# Patient Record
Sex: Male | Born: 1950 | Race: Black or African American | Hispanic: No | Marital: Married | Smoking: Never smoker
Health system: Southern US, Community
[De-identification: ages and names within clinical notes are randomized; demographics above are authoritative.]

## PROBLEM LIST (undated history)

## (undated) DIAGNOSIS — T8859XA Other complications of anesthesia, initial encounter: Secondary | ICD-10-CM

## (undated) DIAGNOSIS — E119 Type 2 diabetes mellitus without complications: Secondary | ICD-10-CM

## (undated) DIAGNOSIS — T4145XA Adverse effect of unspecified anesthetic, initial encounter: Secondary | ICD-10-CM

## (undated) DIAGNOSIS — I10 Essential (primary) hypertension: Secondary | ICD-10-CM

## (undated) DIAGNOSIS — C801 Malignant (primary) neoplasm, unspecified: Secondary | ICD-10-CM

## (undated) DIAGNOSIS — D649 Anemia, unspecified: Secondary | ICD-10-CM

## (undated) HISTORY — PX: OTHER SURGICAL HISTORY: SHX169

## (undated) HISTORY — DX: Type 2 diabetes mellitus without complications: E11.9

## (undated) HISTORY — PX: KNEE ARTHROSCOPY: SUR90

## (undated) HISTORY — PX: SHOULDER ARTHROSCOPY W/ ROTATOR CUFF REPAIR: SHX2400

---

## 2007-07-20 ENCOUNTER — Ambulatory Visit (HOSPITAL_BASED_OUTPATIENT_CLINIC_OR_DEPARTMENT_OTHER): Admission: RE | Admit: 2007-07-20 | Discharge: 2007-07-21 | Payer: Self-pay | Admitting: Orthopedic Surgery

## 2008-12-04 ENCOUNTER — Encounter: Admission: RE | Admit: 2008-12-04 | Discharge: 2008-12-04 | Payer: Self-pay | Admitting: Internal Medicine

## 2010-08-18 NOTE — Op Note (Signed)
NAMEORVA, GWALTNEY             ACCOUNT NO.:  [id number]   MEDICAL RECORD NO.:  [id number]          PATIENT TYPE:  AMB   LOCATION:  DSC                          FACILITY:  [facility]   PHYSICIAN:  Katy Fitch. Sypher, M.D. DATE OF BIRTH:  1950-08-10   DATE OF PROCEDURE:  07/20/2007  DATE OF DISCHARGE:                               OPERATIVE REPORT   PREOPERATIVE DIAGNOSES:  1. Complex chronic rotator cuff tear, right shoulder, with type 3      acromial morphology.  2. Significant acromioclavicular degenerative arthritis.   POSTOPERATIVE DIAGNOSES:  1. Complex chronic rotator cuff tear, right shoulder, with type 3      acromial morphology.  2. Significant acromioclavicular degenerative arthritis.  3. Identification of additional predicaments of mild adhesive      capsulitis, labral degenerative changes, and minor degenerative      changes of the superior subscapularis.   OPERATION:  1. Examination of right shoulder under anesthesia demonstrating mild      adhesive capsulitis but otherwise stable shoulder.  2. Diagnostic arthroscopy, right shoulder, followed by arthroscopic      debridement of rotator cuff tear involving the superior      subscapularis, entire supraspinatus and infraspinatus, and      debridement of labral pathology.  3. Arthroscopic subacromial decompression with bursectomy,      coracoacromial ligament relaxation, and acromioplasty.  4. Arthroscopic distal clavicle resection  5. Reconstruction of rotator cuff with a hybrid arthroscopic and open      technique.   We placed 3 medial Bio-Corkscrew anchors and performed a double-row  repair anatomically reconstructing the supraspinatus, infraspinatus to a  decorticated and profile lowered greater tuberosity   OPERATIONS:  Katy Fitch. Sypher, M.D.   ASSISTANT:  Cindee Salt, M.D.   ANESTHESIA:  General endotracheal supplemented by right interscalene  block.   SUPERVISING ANESTHESIOLOGIST:  Bedelia Person, M.D.   INDICATIONS:  Tremon Sainvil is a 60 year old left-hand dominant  gentleman referred through the courtesy of Dr. Lyna Poser of [facility].   Dr. Kevan Ny has followed Mr. Winemiller for several months of right shoulder  pain unresponsive to steroid injection.   Due to Dr. Kevan Ny identifying weakness of abduction/external rotation as  well as night pain, Mr. Ashurst was referred for an upper extremity  orthopedic consult.   On clinical examination, he was noted to have signs of impingement and  weakness of abduction/external rotation suggesting rotator cuff  pathology.  Plain x-rays of his shoulder documented unfavorable anatomy  of the acromial arch with a type 3 morphology and AC arthropathy.  There  was some reactive change in greater tuberosity.   We advised Mr. Hazelrigg to obtain an MRI of the right shoulder.   An MRI was accomplished at [facility] documenting a significant  retracted double L-shaped tear of the supraspinatus, infraspinatus with  some tendinopathy of the subscapularis.  The [facility] anatomy was unfavorable.   We advised Mr. Rehm to proceed with diagnostic arthroscopy  anticipating arthroscopic debridement, decompression, distal clavicle  resection, and either open or arthroscopic repair of the rotator cuff  tear.  Preoperatively, he was advised that if technically feasible we would  attempt an arthroscopic repair; however, due to the size of his repair,  he was advised that more likely than not an incision would be required.   After informed consent, he is brought to the operating room at this  time.   PROCEDURE:  Providence Lanius was brought to the operating room and  placed in the supine position on the operating table.   Following an anesthesia consult by Dr. Gypsy Balsam, a right interscalene block  was placed in the holding area without complication.   Excellent anesthesia of the right shoulder and forequarter was obtained.   He was  brought to room #8, placed in supine position on the operating  table, and under Dr. Burnett Corrente strict supervision, general endotracheal  anesthesia was induced.  Mr. Beever was then positioned in the beach-  chair position with the aid of torso and head holder designed for  photoarthroscopy.   The entire shoulder and arm were prepped with DuraPrep and draped with  impervious arthroscopy drapes.  One gram of Ancef was administered as an  IV prophylactic antibiotic.   Procedure commenced with examination of the right shoulder under  anesthesia.  Mr. Esterline had combined elevation to 165 degrees, slight  loss of full extension due to mild adhesive capsulitis, internal  rotation of approximately 70 degrees.   We did not manipulate his joint.   We subsequently placed the arthroscope through a standard posterior  viewing portal with blunt technique after distention of the joint with  15 mL of sterile saline brought in anteriorly with a spinal needle.   Diagnostic arthroscopy confirmed mild adhesive capsulitis and  degenerative changes of the labrum from 12 o'clock superiorly to 3  o'clock anteriorly.  The posterior labrum, inferior labrum, and inferior  recess were normal.  The superior recess was normal.  The subscapularis  was invested in adhesive capsulitis granulation tissue.  This was  cleared with suction shaver brought in anteriorly revealing a minimal  tear of the superior subscapularis less than 10% of its fibers.  The  long head of the biceps was stable at its origin at the superior labrum  and normal through the rotator interval.  The entire supraspinatus,  infraspinatus was avulsed from the greater tuberosity.  There was  reactive bone formation at the greater tuberosity.   After creation of arthroscopic portals with a spinal needle, the suction  shaver was used to debride remnants of the rotator cuff from the greater  tuberosity followed by decortication of the greater  tuberosity with  suction bur while visualizing from within the joint.  After lowering the  profile of the greater tuberosity approximately 3 mm, we placed 2 medial  Bio-Corkscrew anchors anticipating repair of the supraspinatus and  infraspinatus.  We then placed the scope in the subacromial space and  performed a debridement of the coracoacromial ligament bursectomy  followed by leveling of the acromion to a type 1 morphology and  resection of the distal 15 mm of the clavicle with arthroscopic  technique.   We then undertook a two-row repair of the supraspinatus, infraspinatus  utilizing a Scorpion suture passer to place medial sutures.  After the  sutures were placed just lateral to the musculotendinous junction, we  are able to advance the tendon to an anatomic footprint.   As we began to tie knots, we identified a predicament with one limb of  our posterior suture being snagged on an anterior  suture loop.  It  appears that with the Scorpion being placed intraarticularly a loop of  suture that was not under perfect tension was caught in the scorpion  causing a snag.   Given the effect on a double-row repair that this could have, in my  opinion, it was appropriate to abort and proceed with a direct  visualization of the tuberosity so that all suture technique would be  perfect.   The arthroscopic equipment was removed, and a 4-cm incision was  fashioned at the anterior middle-third of the deltoid.  The deltoid was  split and the tuberosity visualized.   The suture was looped, and this was easily relieved with a pair of  forceps followed by completion of a 3 Bio-Corkscrew medial row repair  followed by over-the-top technique, insetting the margin of the sutures  with 2 lateral Swivel-Lock suture anchor sets.  An anatomic repair of  the supraspinatus and infraspinatus to the decorticated and re-profiled  greater tuberosity was achieved.   A hand rasp was used to feather the  lateral margin of the acromion to  prevent residual impingement followed by thorough irrigation of  subacromial space with the arthroscopic pump.  The deltoid was  reinforced at the superior apex with a mattress suture of #2 FiberWire  followed by repair of the deltoid split with simple suture of 0 Vicryl.   The skin was repaired with subdermal sutures of 0 Vicryl and 3-0 Vicryl  and intradermal 2-0 Prolene.  The portals were repaired with mattress  suture of 3-0 Prolene.   There are no apparent complications.  Mr. Resor tolerated the surgery  and anesthesia well.  He was transferred to the recovery room with  stable signs.      Katy Fitch Sypher, M.D.  Electronically Signed     RVS/MEDQ  D:  07/20/2007  T:  07/21/2007  Job:  [id number]   cc:   Candyce Churn, M.D.

## 2010-12-29 LAB — BASIC METABOLIC PANEL
BUN: 16
CO2: 28
Chloride: 98
Creatinine, Ser: 1.08
Potassium: 4.1

## 2014-07-11 ENCOUNTER — Encounter (HOSPITAL_COMMUNITY): Payer: Self-pay | Admitting: *Deleted

## 2014-07-11 ENCOUNTER — Other Ambulatory Visit: Payer: Self-pay | Admitting: Gastroenterology

## 2014-07-15 ENCOUNTER — Ambulatory Visit (HOSPITAL_COMMUNITY): Payer: 59 | Admitting: Certified Registered"

## 2014-07-15 ENCOUNTER — Ambulatory Visit (HOSPITAL_COMMUNITY)
Admission: RE | Admit: 2014-07-15 | Discharge: 2014-07-15 | Disposition: A | Discharge status: Home / Self Care | Payer: 59 | Source: Ambulatory Visit | Attending: Gastroenterology | Admitting: Gastroenterology

## 2014-07-15 ENCOUNTER — Encounter (HOSPITAL_COMMUNITY)
Admission: RE | Disposition: A | Discharge status: Home / Self Care | Payer: Self-pay | Source: Ambulatory Visit | Attending: Gastroenterology

## 2014-07-15 ENCOUNTER — Encounter (HOSPITAL_COMMUNITY): Payer: Self-pay | Admitting: Certified Registered"

## 2014-07-15 DIAGNOSIS — K921 Melena: Secondary | ICD-10-CM | POA: Insufficient documentation

## 2014-07-15 DIAGNOSIS — D122 Benign neoplasm of ascending colon: Secondary | ICD-10-CM | POA: Insufficient documentation

## 2014-07-15 DIAGNOSIS — B9681 Helicobacter pylori [H. pylori] as the cause of diseases classified elsewhere: Secondary | ICD-10-CM | POA: Insufficient documentation

## 2014-07-15 DIAGNOSIS — C184 Malignant neoplasm of transverse colon: Secondary | ICD-10-CM | POA: Insufficient documentation

## 2014-07-15 DIAGNOSIS — K2950 Unspecified chronic gastritis without bleeding: Secondary | ICD-10-CM | POA: Insufficient documentation

## 2014-07-15 DIAGNOSIS — I10 Essential (primary) hypertension: Secondary | ICD-10-CM | POA: Diagnosis not present

## 2014-07-15 DIAGNOSIS — D509 Iron deficiency anemia, unspecified: Secondary | ICD-10-CM | POA: Diagnosis present

## 2014-07-15 DIAGNOSIS — M4302 Spondylolysis, cervical region: Secondary | ICD-10-CM | POA: Insufficient documentation

## 2014-07-15 HISTORY — DX: Essential (primary) hypertension: I10

## 2014-07-15 HISTORY — PX: COLONOSCOPY WITH PROPOFOL: SHX5780

## 2014-07-15 HISTORY — PX: ESOPHAGOGASTRODUODENOSCOPY (EGD) WITH PROPOFOL: SHX5813

## 2014-07-15 SURGERY — ESOPHAGOGASTRODUODENOSCOPY (EGD) WITH PROPOFOL
Anesthesia: Monitor Anesthesia Care

## 2014-07-15 MED ORDER — LIDOCAINE HCL (PF) 2 % IJ SOLN
INTRAMUSCULAR | Status: DC | PRN
  Administered 2014-07-15: 20 mg via INTRADERMAL

## 2014-07-15 MED ORDER — LACTATED RINGERS IV SOLN: INTRAVENOUS | Status: DC

## 2014-07-15 MED ORDER — LACTATED RINGERS IV SOLN
INTRAVENOUS | Status: DC | PRN
  Administered 2014-07-15: 07:00:00 via INTRAVENOUS

## 2014-07-15 MED ORDER — SODIUM CHLORIDE 0.9 % IV SOLN: INTRAVENOUS | Status: DC

## 2014-07-15 MED ORDER — PROPOFOL 10 MG/ML IV BOLUS
INTRAVENOUS | Status: AC
  Filled 2014-07-15: qty 20

## 2014-07-15 MED ORDER — LIDOCAINE HCL (CARDIAC) 20 MG/ML IV SOLN
INTRAVENOUS | Status: AC
  Filled 2014-07-15: qty 5

## 2014-07-15 MED ORDER — PROPOFOL 10 MG/ML IV BOLUS
INTRAVENOUS | Status: DC | PRN
  Administered 2014-07-15: 50 mg via INTRAVENOUS
  Administered 2014-07-15: 150 mg via INTRAVENOUS
  Administered 2014-07-15: 50 mg via INTRAVENOUS
  Administered 2014-07-15 (×2): 10 mg via INTRAVENOUS
  Administered 2014-07-15: 50 mg via INTRAVENOUS
  Administered 2014-07-15: 100 mg via INTRAVENOUS
  Administered 2014-07-15: 50 mg via INTRAVENOUS

## 2014-07-15 MED ORDER — SPOT INK MARKER SYRINGE KIT
PACK | SUBMUCOSAL | Status: AC
  Filled 2014-07-15: qty 5

## 2014-07-15 MED ORDER — BUTAMBEN-TETRACAINE-BENZOCAINE 2-2-14 % EX AERO
INHALATION_SPRAY | CUTANEOUS | Status: DC | PRN
  Administered 2014-07-15: 1 via TOPICAL

## 2014-07-15 MED ORDER — SPOT INK MARKER SYRINGE KIT
PACK | SUBMUCOSAL | Status: DC | PRN
  Administered 2014-07-15: 3 mL via SUBMUCOSAL

## 2014-07-15 SURGICAL SUPPLY — 22 items

## 2014-07-15 NOTE — Op Note (Signed)
Problem: Iron deficiency anemia. Hemoglobin 10.3 g. Serum iron saturation 4%. Serum ferritin 4.6 ng/mL  Endoscopist: Earle Gell  Premedication: Propofol administered by anesthesia  Procedure: Diagnostic esophagogastroduodenoscopy The patient was placed in the left lateral decubitus position. The Pentax gastroscope was passed through the posterior hypopharynx into the proximal esophagus without difficulty. The hypopharynx, larynx, and vocal cords appeared normal.  Esophagoscopy: The proximal, mid, and lower segments of the esophageal mucosa appeared normal. The squamocolumnar junction was regular in appearance and noted at 40 cm from incisor teeth.  Gastroscopy: Retroflex view of the gastric cardia and fundus was normal. The gastric body appeared normal. Mucosa in the proximal gastric antrum on the greater curvature appeared cobblestone. The gastric antrum and pylorus otherwise appeared normal. Biopsies were performed from the gastric antrum, gastric body, and gastric cardia to rule out H. pylori gastritis.  Duodenoscopy: The duodenal bulb and descending duodenum appeared normal. Small bowel biopsies were performed if needed to look for celiac disease to explain iron deficiency anemia. The biopsies were discarded after finding a circumferential tumor in the proximal transverse colon consistent with an adenocarcinoma.  Assessment: Normal esophagogastroduodenoscopy with gastric biopsies pending to look for H. pylori gastritis  Procedure: Diagnostic colonoscopy Anal inspection and digital rectal exam were normal. The Pentax pediatric colonoscope was introduced into the rectum and advanced to the cecum. A normal-appearing appendiceal orifice was identified. A normal-appearing ileocecal valve was intubated and the terminal ileum inspected. Colonic preparation for the exam today was good after vigorous water lavage of the mucus in the right colon.  Rectum. Normal. Retroflexed view of the distal  rectum normal  Sigmoid colon and descending colon. Normal  Splenic flexure. Normal  Transverse colon. 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 Biopsies were performed. Spot was used to mark the proximal and distal extent of the tumor.  Hepatic flexure. Normal  Ascending colon. From the mid ascending colon, a 5 mm sessile polyp was removed with the cold snare  Cecum and ileocecal valve. Normal  Terminal ileum. Normal  Assessment  #1. 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  #2. A 5 mm sessile polyp was removed from the mid ascending colon.  Recommendation: Refer to surgery for segmental resection of the proximal transverse colon tumor. Screen for Lynch syndrome by immunohistochemical staining of the colon cancer.

## 2014-07-15 NOTE — Anesthesia Postprocedure Evaluation (Signed)
Anesthesia Post Note  Patient: Mark Owen  Procedure(s) Performed: Procedure(s) (LRB): COLONOSCOPY WITH PROPOFOL (N/A) ESOPHAGOGASTRODUODENOSCOPY (EGD) WITH PROPOFOL (N/A)  Anesthesia type: MAC  Patient location: PACU  Post pain: Pain level controlled  Post assessment: Post-op Vital signs reviewed  Last Vitals: BP 138/99 mmHg  Pulse 59  Temp(Src) 36.4 C (Oral)  Resp 15  SpO2 100%  Post vital signs: Reviewed  Level of consciousness: awake  Complications: No apparent anesthesia complications

## 2014-07-15 NOTE — Anesthesia Preprocedure Evaluation (Signed)
Anesthesia Evaluation  Patient identified by MRN, date of birth, ID band Patient awake    Reviewed: Allergy & Precautions, NPO status , Patient's Chart, lab work & pertinent test results  Airway Mallampati: II  TM Distance: >3 FB Neck ROM: Full    Dental no notable dental hx.    Pulmonary neg pulmonary ROS,  breath sounds clear to auscultation  Pulmonary exam normal       Cardiovascular hypertension, Pt. on medications Rhythm:Regular Rate:Normal     Neuro/Psych negative neurological ROS  negative psych ROS   GI/Hepatic negative GI ROS, Neg liver ROS,   Endo/Other  negative endocrine ROS  Renal/GU negative Renal ROS     Musculoskeletal negative musculoskeletal ROS (+)   Abdominal   Peds  Hematology negative hematology ROS (+)   Anesthesia Other Findings   Reproductive/Obstetrics negative OB ROS                             Anesthesia Physical Anesthesia Plan  ASA: II  Anesthesia Plan: MAC   Post-op Pain Management:    Induction: Intravenous  Airway Management Planned: Simple Face Mask, Natural Airway and Nasal Cannula  Additional Equipment: None  Intra-op Plan:   Post-operative Plan:   Informed Consent: I have reviewed the patients History and Physical, chart, labs and discussed the procedure including the risks, benefits and alternatives for the proposed anesthesia with the patient or authorized representative who has indicated his/her understanding and acceptance.   Dental advisory given  Plan Discussed with: CRNA  Anesthesia Plan Comments:         Anesthesia Quick Evaluation

## 2014-07-15 NOTE — Transfer of Care (Signed)
Immediate Anesthesia Transfer of Care Note  Patient: Mark Owen  Procedure(s) Performed: Procedure(s) (LRB): COLONOSCOPY WITH PROPOFOL (N/A) ESOPHAGOGASTRODUODENOSCOPY (EGD) WITH PROPOFOL (N/A)  Patient Location: PACU  Anesthesia Type: MAC  Level of Consciousness: sedated, patient cooperative and responds to stimulation  Airway & Oxygen Therapy: Patient Spontanous Breathing and Patient connected to face mask oxgen  Post-op Assessment: Report given to PACU RN and Post -op Vital signs reviewed and stable  Post vital signs: Reviewed and stable  Complications: No apparent anesthesia complications

## 2014-07-15 NOTE — Discharge Instructions (Signed)
Colonoscopy, Care After °Refer to this sheet in the next few weeks. These instructions provide you with information on caring for yourself after your procedure. Your health care provider may also give you more specific instructions. Your treatment has been planned according to current medical practices, but problems sometimes occur. Call your health care provider if you have any problems or questions after your procedure. °WHAT TO EXPECT AFTER THE PROCEDURE  °After your procedure, it is typical to have the following: °· A small amount of blood in your stool. °· Moderate amounts of gas and mild abdominal cramping or bloating. °HOME CARE INSTRUCTIONS °· Do not drive, operate machinery, or sign important documents for 24 hours. °· You may shower and resume your regular physical activities, but move at a slower pace for the first 24 hours. °· Take frequent rest periods for the first 24 hours. °· Walk around or put a warm pack on your abdomen to help reduce abdominal cramping and bloating. °· Drink enough fluids to keep your urine clear or pale yellow. °· You may resume your normal diet as instructed by your health care provider. Avoid heavy or fried foods that are hard to digest. °· Avoid drinking alcohol for 24 hours or as instructed by your health care provider. °· Only take over-the-counter or prescription medicines as directed by your health care provider. °· If a tissue sample (biopsy) was taken during your procedure: °· Do not take aspirin or blood thinners for 7 days, or as instructed by your health care provider. °· Do not drink alcohol for 7 days, or as instructed by your health care provider. °· Eat soft foods for the first 24 hours. °SEEK MEDICAL CARE IF: °You have persistent spotting of blood in your stool 2-3 days after the procedure. °SEEK IMMEDIATE MEDICAL CARE IF: °· You have more than a small spotting of blood in your stool. °· You pass large blood clots in your stool. °· Your abdomen is swollen  (distended). °· You have nausea or vomiting. °· You have a fever. °· You have increasing abdominal pain that is not relieved with medicine. °Document Released: 11/04/2003 Document Revised: 01/10/2013 Document Reviewed: 11/27/2012 °Exit[employer]® Patient Information ©2015 [employer]. This information is not intended to replace advice given to you by your health care provider. Make sure you discuss any questions you have with your health care provider. °Esophagogastroduodenoscopy °Care After °Refer to this sheet in the next few weeks. These instructions provide you with information on caring for yourself after your procedure. Your caregiver may also give you more specific instructions. Your treatment has been planned according to current medical practices, but problems sometimes occur. Call your caregiver if you have any problems or questions after your procedure.  °HOME CARE INSTRUCTIONS °· Do not eat or drink anything until the numbing medicine (local anesthetic) has worn off and your gag reflex has returned. You will know that the local anesthetic has worn off when you can swallow comfortably. °· Do not drive for 12 hours after the procedure or as directed by your caregiver. °· Only take medicines as directed by your caregiver. °SEEK MEDICAL CARE IF:  °· You cannot stop coughing. °· You are not urinating at all or less than usual. °SEEK IMMEDIATE MEDICAL CARE IF: °· You have difficulty swallowing. °· You cannot eat or drink. °· You have worsening throat or chest pain. °· You have dizziness, lightheadedness, or you faint. °· You have nausea or vomiting. °· You have chills. °· You have a   fever. °· You have severe abdominal pain. °· You have black, tarry, or bloody stools. °Document Released: 03/08/2012 Document Reviewed: 03/08/2012 °[employer]® Patient Information ©2015 [employer]. This information is not intended to replace advice given to you by your health care provider. Make sure you discuss any questions you have  with your health care provider. ° ° °

## 2014-07-15 NOTE — H&P (Signed)
  Problems: Iron deficiency anemia and hematochezia. 06/15/2007 normal screening colonoscopy. Hemoglobin 10.3 g. Serum ferritin 4.6 ng/mL.  History: The patient is a 64 year old male born 06-08-1950. He underwent a normal screening colonoscopy in March 2009.  The patient has been feeling well and exercises on a regular basis. He remains quite physically active and has not been experiencing fatigue or shortness of breath.  In February 2016, the patient was diagnosed with iron deficiency anemia. The stool samples he submitted for Hemoccult testing were negative.  He reports no abdominal pain or hematuria. He occasionally spots of blood on the toilet tissue following bowel movements.  The patient takes up to 4 ibuprofen tablets per week.  He is scheduled to undergo diagnostic esophagogastroduodenoscopy and colonoscopy to evaluate iron deficiency anemia and hematochezia.  Medication allergies: Penicillin. Lumbar anesthesia.  Past medical history: Hypertension. Cervical osteoarthritis. Bilateral rotator cuff surgeries. Epidural injections for lumbar degenerative disc disease.  Exam: The patient is alert and lying comfortably on the endoscopy stretcher. Abdomen is soft and nontender to palpation. Lungs are clear to auscultation. Cardiac exam reveals a regular rhythm.  Plan: Proceed with diagnostic esophagogastroduodenoscopy and colonoscopy to evaluate iron deficiency anemia and hematochezia

## 2014-07-16 ENCOUNTER — Encounter (HOSPITAL_COMMUNITY): Payer: Self-pay | Admitting: Gastroenterology

## 2014-07-17 ENCOUNTER — Other Ambulatory Visit: Payer: Self-pay | Admitting: Gastroenterology

## 2014-07-17 DIAGNOSIS — C184 Malignant neoplasm of transverse colon: Secondary | ICD-10-CM

## 2014-07-22 ENCOUNTER — Ambulatory Visit
Admission: RE | Admit: 2014-07-22 | Discharge: 2014-07-22 | Disposition: A | Discharge status: Home / Self Care | Payer: 59 | Source: Ambulatory Visit | Attending: Gastroenterology | Admitting: Gastroenterology

## 2014-07-22 DIAGNOSIS — C184 Malignant neoplasm of transverse colon: Secondary | ICD-10-CM

## 2014-07-22 MED ORDER — IOPAMIDOL (ISOVUE-300) INJECTION 61%
100.0000 mL | Freq: Once | INTRAVENOUS | Status: AC | PRN
  Administered 2014-07-22: 100 mL via INTRAVENOUS

## 2014-07-26 ENCOUNTER — Other Ambulatory Visit: Payer: Self-pay | Admitting: General Surgery

## 2014-07-26 DIAGNOSIS — C189 Malignant neoplasm of colon, unspecified: Secondary | ICD-10-CM

## 2014-07-26 NOTE — H&P (Signed)
Mark Owen 07/26/2014 2:00 PM Location: [facility] Patient #: [id number] DOB: 1950/07/03 Married / Language: English / Race: Black or African American Male History of Present Illness Mark Ruff MD; 3/81/0175 3:18 PM) Patient words: colon cancer.  The patient is a 64 year old male who presents with colorectal cancer. 64 year old male who presented with chronic anemia. He underwent a colonoscopy which showed a mass in his hepatic flexure. Biopsies confirm adenocarcinoma. CT scans of chest abdomen and pelvis showed no signs of metastatic disease. The mass is tattooed proximally and distally. It is clearly seen at the hepatic flexure on CT scan. Patient denies any changes in his bowel habits. He is currently taking iron on a twice a day basis. He denies any weight loss or abdominal pain. Other Problems Mark Owen, CMA; 07/26/2014 2:01 PM) Back Pain Colon Cancer High blood pressure  Past Surgical History Mark Owen, CMA; 07/26/2014 2:01 PM) Colon Polyp Removal - Colonoscopy Colon Polyp Removal - Open Knee Surgery Right. Oral Surgery Shoulder Surgery Bilateral.  Diagnostic Studies History Mark Owen, CMA; 07/26/2014 2:01 PM) Colonoscopy within last year  Allergies Mark Owen, [place]; 07/26/2014 2:03 PM) Penicillamine *ASSORTED CLASSES*  Medication History Mark Owen, CMA; 07/26/2014 2:05 PM) Lisinopril-Hydrochlorothiazide (10-12.5MG  Tablet, Oral) Active. Iron (65MG  Tablet, Oral) Active. Multivitamins (Oral) Active. Medications Reconciled  Social History Mark Owen, CMA; 07/26/2014 2:01 PM) Caffeine use Carbonated beverages, Coffee, Tea. No alcohol use No drug use Tobacco use Never smoker.  Family History Mark Owen, [place]; 07/26/2014 2:01 PM) Alcohol Abuse Father. Diabetes Mellitus Father. Hypertension Family Members In General, Father. Kidney Disease Father.     Review of Systems ([place]; 07/26/2014 2:01  PM) General Present- Fatigue. Not Present- Appetite Loss, Chills, Fever, Night Sweats, Weight Gain and Weight Loss. Skin Present- Dryness. Not Present- Change in Wart/Mole, Hives, Jaundice, New Lesions, Non-Healing Wounds, Rash and Ulcer. HEENT Present- Wears glasses/contact lenses. Not Present- Earache, Hearing Loss, Hoarseness, Nose Bleed, Oral Ulcers, Ringing in the Ears, Seasonal Allergies, Sinus Pain, Sore Throat, Visual Disturbances and Yellow Eyes. Respiratory Not Present- Bloody sputum, Chronic Cough, Difficulty Breathing, Snoring and Wheezing. Breast Not Present- Breast Mass, Breast Pain, Nipple Discharge and Skin Changes. Cardiovascular Present- Leg Cramps. Not Present- Chest Pain, Difficulty Breathing Lying Down, Palpitations, Rapid Heart Rate, Shortness of Breath and Swelling of Extremities. Gastrointestinal Present- Excessive gas. Not Present- Abdominal Pain, Bloating, Bloody Stool, Change in Bowel Habits, Chronic diarrhea, Constipation, Difficulty Swallowing, Gets full quickly at meals, Hemorrhoids, Indigestion, Nausea, Rectal Pain and Vomiting. Male Genitourinary Not Present- Blood in Urine, Change in Urinary Stream, Frequency, Impotence, Nocturia, Painful Urination, Urgency and Urine Leakage. Musculoskeletal Present- Back Pain. Not Present- Joint Pain, Joint Stiffness, Muscle Pain, Muscle Weakness and Swelling of Extremities. Neurological Not Present- Decreased Memory, Fainting, Headaches, Numbness, Seizures, Tingling, Tremor, Trouble walking and Weakness. Psychiatric Not Present- Anxiety, Bipolar, Change in Sleep Pattern, Depression, Fearful and Frequent crying. Endocrine Not Present- Cold Intolerance, Excessive Hunger, Hair Changes, Heat Intolerance, Hot flashes and New Diabetes. Hematology Not Present- Easy Bruising, Excessive bleeding, Gland problems, HIV and Persistent Infections.  Vitals (Mark Owen CMA; 07/26/2014 2:02 PM) 07/26/2014 2:01 PM Weight: 185.2 lb Height:  72in Body Surface Area: 2.07 m Body Mass Index: 25.12 kg/m Temp.: 97.61F(Temporal)  Pulse: 77 (Regular)  BP: 128/78 (Sitting, Left Arm, Standard)     Physical Exam Mark Ruff MD; 04/06/5850 3:19 PM)  General Mental Status-Alert. General Appearance-Consistent with stated age. Hydration-Well hydrated. Voice-Normal.  Head and Neck Head-normocephalic, atraumatic with no lesions or  palpable masses. Trachea-midline. Thyroid Gland Characteristics - normal size and consistency.  Eye Eyeball - Bilateral-Extraocular movements intact. Sclera/Conjunctiva - Bilateral-No scleral icterus.  Chest and Lung Exam Chest and lung exam reveals -quiet, even and easy respiratory effort with no use of accessory muscles and on auscultation, normal breath sounds, no adventitious sounds and normal vocal resonance. Inspection Chest Wall - Normal. Back - normal.  Cardiovascular Cardiovascular examination reveals -normal heart sounds, regular rate and rhythm with no murmurs and normal pedal pulses bilaterally.  Abdomen Inspection Inspection of the abdomen reveals - No Hernias. Palpation/Percussion Palpation and Percussion of the abdomen reveal - Soft, Non Tender, No Rebound tenderness, No Rigidity (guarding) and No hepatosplenomegaly. Auscultation Auscultation of the abdomen reveals - Bowel sounds normal.  Neurologic Neurologic evaluation reveals -alert and oriented x 3 with no impairment of recent or remote memory. Mental Status-Normal.  Musculoskeletal Global Assessment -Note:no gross deformities.  Normal Exam - Left-Upper Extremity Strength Normal and Lower Extremity Strength Normal. Normal Exam - Right-Upper Extremity Strength Normal and Lower Extremity Strength Normal.    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 Hepatic flexure cancer was noted. This was tattooed distally and proximally. CT scan of the chest and pelvis showed no signs of metastatic disease. His CEA level is pending. I have recommended that he undergo minimally invasive right hemicolectomy.  The surgery and anatomy were described to the patient as well as the risks of surgery and the possible complications. These include: Bleeding, deep abdominal infections and possible wound complications such as hernia and infection, damage to adjacent structures, leak of surgical connections, which can lead to other surgeries and possibly an ostomy, possible need for other procedures, such as abscess drains in radiology, possible prolonged hospital stay, possible diarrhea from removal of part of the colon, possible constipation from narcotics, possible bowel, bladder or sexual dysfunction if having rectal surgery, prolonged fatigue/weakness or appetite loss, possible early recurrence of of disease, possible complications of their medical problems such as heart disease or arrhythmias or lung problems, death (less than 1%). I believe the patient understands and wishes to proceed with the surgery.  Current Plans Pt Education - [facility] Colon Bowel Prep 2015 Miralax/Antibiotics Started Neomycin Sulfate 500MG , 2 (two) Tablet SEE NOTE, #6, 07/26/2014, No Refill. Local Order: TAKE TWO TABLETS AT 2 PM, 3 PM, AND 10 PM THE DAY PRIOR TO SURGERY Started Flagyl 500MG , 2 (two) Tablet SEE NOTE, #6, 07/26/2014, No Refill. Local Order: Take at 2pm, 3pm, and 10pm the day prior to your colon operation Pt Education - [facility] Colorectal Cancer (AT)

## 2014-08-05 ENCOUNTER — Encounter (HOSPITAL_COMMUNITY)
Admission: RE | Admit: 2014-08-05 | Discharge: 2014-08-05 | Disposition: A | Discharge status: Home / Self Care | Payer: 59 | Source: Ambulatory Visit | Attending: General Surgery | Admitting: General Surgery

## 2014-08-05 ENCOUNTER — Encounter (HOSPITAL_COMMUNITY): Payer: Self-pay

## 2014-08-05 DIAGNOSIS — C189 Malignant neoplasm of colon, unspecified: Secondary | ICD-10-CM | POA: Insufficient documentation

## 2014-08-05 DIAGNOSIS — Z01812 Encounter for preprocedural laboratory examination: Secondary | ICD-10-CM | POA: Insufficient documentation

## 2014-08-05 DIAGNOSIS — Z01810 Encounter for preprocedural cardiovascular examination: Secondary | ICD-10-CM

## 2014-08-05 HISTORY — DX: Malignant (primary) neoplasm, unspecified: C80.1

## 2014-08-05 HISTORY — DX: Adverse effect of unspecified anesthetic, initial encounter: T41.45XA

## 2014-08-05 HISTORY — DX: Other complications of anesthesia, initial encounter: T88.59XA

## 2014-08-05 HISTORY — DX: Anemia, unspecified: D64.9

## 2014-08-05 LAB — CBC
HEMATOCRIT: 35.6 % — AB (ref 39.0–52.0)
HEMOGLOBIN: 11 g/dL — AB (ref 13.0–17.0)
MCH: 23.5 pg — ABNORMAL LOW (ref 26.0–34.0)
MCHC: 30.9 g/dL (ref 30.0–36.0)
MCV: 76.1 fL — ABNORMAL LOW (ref 78.0–100.0)
PLATELETS: 290 10*3/uL (ref 150–400)
RBC: 4.68 MIL/uL (ref 4.22–5.81)
RDW: 19.4 % — AB (ref 11.5–15.5)
WBC: 6.6 10*3/uL (ref 4.0–10.5)

## 2014-08-05 LAB — BASIC METABOLIC PANEL
ANION GAP: 7 (ref 5–15)
BUN: 23 mg/dL — ABNORMAL HIGH (ref 6–20)
CALCIUM: 9.5 mg/dL (ref 8.9–10.3)
CHLORIDE: 104 mmol/L (ref 101–111)
CO2: 25 mmol/L (ref 22–32)
Creatinine, Ser: 1.63 mg/dL — ABNORMAL HIGH (ref 0.61–1.24)
GFR, EST AFRICAN AMERICAN: 51 mL/min — AB (ref >60)
GFR, EST NON AFRICAN AMERICAN: 44 mL/min — AB (ref >60)
GLUCOSE: 127 mg/dL — AB (ref 70–99)
POTASSIUM: 3.5 mmol/L (ref 3.5–5.1)
Sodium: 136 mmol/L (ref 135–145)

## 2014-08-05 LAB — ABO/RH: ABO/RH(D): AB POS

## 2014-08-05 NOTE — Progress Notes (Signed)
   08/05/14 1551  OBSTRUCTIVE SLEEP APNEA  Have you ever been diagnosed with sleep apnea through a sleep study? No  Do you snore loudly (loud enough to be heard through closed doors)?  0  Do you often feel tired, fatigued, or sleepy during the daytime? 0  Has anyone observed you stop breathing during your sleep? 0  Do you have, or are you being treated for high blood pressure? 1  BMI more than 35 kg/m2? 0  Age over 64 years old? 1  Neck circumference greater than 40 cm/16 inches? 1  Gender: 1

## 2014-08-05 NOTE — Patient Instructions (Signed)
ESCO JOSLYN  08/05/2014   Your procedure is scheduled on: Thursday 08/08/2014  Report to [facility] Main  Entrance and follow signs to               [place] at 1100 AM.  Call this number if you have problems the morning of surgery [phone]   Remember: [place] DR. THOMAS'S OFFICE!   Do not eat foods :After Midnight. MAY HAVE CLEAR LIQUIDS FROM MIDNIGHT UP UNTIL 0700 AM MORNING OF SURGERY THEN NOTHING UNTIL AFTER SURGERY!     Take these medicines the morning of surgery with A SIP OF WATER: NONE                               You may not have any metal on your body including hair pins and              piercings  Do not wear jewelry, make-up, lotions, powders or perfumes.             Do not wear nail polish.  Do not shave  48 hours prior to surgery.              Men may shave face and neck.   Do not bring valuables to the hospital. [place].  Contacts, dentures or bridgework may not be worn into surgery.  Leave suitcase in the car. After surgery it may be brought to your room.     Patients discharged the day of surgery will not be allowed to drive home.  Name and phone number of your driver:  Special Instructions: N/A              Please read over the following fact sheets you were given: _____________________________________________________________________             [facility] - Preparing for Surgery Before surgery, you can play an important role.  Because skin is not sterile, your skin needs to be as free of germs as possible.  You can reduce the number of germs on your skin by washing with CHG (chlorahexidine gluconate) soap before surgery.  CHG is an antiseptic cleaner which kills germs and bonds with the skin to continue killing germs even after washing. Please DO NOT use if you have an allergy to CHG or antibacterial soaps.  If your skin becomes  reddened/irritated stop using the CHG and inform your nurse when you arrive at Short Stay. Do not shave (including legs and underarms) for at least 48 hours prior to the first CHG shower.  You may shave your face/neck. Please follow these instructions carefully:  1.  Shower with CHG Soap the night before surgery and the  morning of Surgery.  2.  If you choose to wash your hair, wash your hair first as usual with your  normal  shampoo.  3.  After you shampoo, rinse your hair and body thoroughly to remove the  shampoo.                           4.  Use CHG as you would any other liquid soap.  You can apply chg directly  to the skin  and wash                       Gently with a scrungie or clean washcloth.  5.  Apply the CHG Soap to your body ONLY FROM THE NECK DOWN.   Do not use on face/ open                           Wound or open sores. Avoid contact with eyes, ears mouth and genitals (private parts).                       Wash face,  Genitals (private parts) with your normal soap.             6.  Wash thoroughly, paying special attention to the area where your surgery  will be performed.  7.  Thoroughly rinse your body with warm water from the neck down.  8.  DO NOT shower/wash with your normal soap after using and rinsing off  the CHG Soap.                9.  Pat yourself dry with a clean towel.            10.  Wear clean pajamas.            11.  Place clean sheets on your bed the night of your first shower and do not  sleep with pets. Day of Surgery : Do not apply any lotions/deodorants the morning of surgery.  Please wear clean clothes to the hospital/surgery center.  FAILURE TO FOLLOW THESE INSTRUCTIONS MAY RESULT IN THE CANCELLATION OF YOUR SURGERY PATIENT SIGNATURE_________________________________  NURSE SIGNATURE__________________________________  ________________________________________________________________________   Adam Phenix  An incentive spirometer is a tool that  can help keep your lungs clear and active. This tool measures how well you are filling your lungs with each breath. Taking long deep breaths may help reverse or decrease the chance of developing breathing (pulmonary) problems (especially infection) following:  A long period of time when you are unable to move or be active. BEFORE THE PROCEDURE   If the spirometer includes an indicator to show your best effort, your nurse or respiratory therapist will set it to a desired goal.  If possible, sit up straight or lean slightly forward. Try not to slouch.  Hold the incentive spirometer in an upright position. INSTRUCTIONS FOR USE   Sit on the edge of your bed if possible, or sit up as far as you can in bed or on a chair.  Hold the incentive spirometer in an upright position.  Breathe out normally.  Place the mouthpiece in your mouth and seal your lips tightly around it.  Breathe in slowly and as deeply as possible, raising the piston or the ball toward the top of the column.  Hold your breath for 3-5 seconds or for as long as possible. Allow the piston or ball to fall to the bottom of the column.  Remove the mouthpiece from your mouth and breathe out normally.  Rest for a few seconds and repeat Steps 1 through 7 at least 10 times every 1-2 hours when you are awake. Take your time and take a few normal breaths between deep breaths.  The spirometer may include an indicator to show your best effort. Use the indicator as a goal to work toward during each repetition.  After each set of 10  deep breaths, practice coughing to be sure your lungs are clear. If you have an incision (the cut made at the time of surgery), support your incision when coughing by placing a pillow or rolled up towels firmly against it. Once you are able to get out of bed, walk around indoors and cough well. You may stop using the incentive spirometer when instructed by your caregiver.  RISKS AND COMPLICATIONS  Take your  time so you do not get dizzy or light-headed.  If you are in pain, you may need to take or ask for pain medication before doing incentive spirometry. It is harder to take a deep breath if you are having pain. AFTER USE  Rest and breathe slowly and easily.  It can be helpful to keep track of a log of your progress. Your caregiver can provide you with a simple table to help with this. If you are using the spirometer at home, follow these instructions: [place] IF:   You are having difficultly using the spirometer.  You have trouble using the spirometer as often as instructed.  Your pain medication is not giving enough relief while using the spirometer.  You develop fever of 100.5 F (38.1 C) or higher. SEEK IMMEDIATE MEDICAL CARE IF:   You cough up bloody sputum that had not been present before.  You develop fever of 102 F (38.9 C) or greater.  You develop worsening pain at or near the incision site. MAKE SURE YOU:   Understand these instructions.  Will watch your condition.  Will get help right away if you are not doing well or get worse. Document Released: 08/02/2006 Document Revised: 06/14/2011 Document Reviewed: 10/03/2006 ExitCare Patient Information 2014 ExitCare, [place].   ________________________________________________________________________  WHAT IS A BLOOD TRANSFUSION? Blood Transfusion Information  A transfusion is the replacement of blood or some of its parts. Blood is made up of multiple cells which provide different functions.  Red blood cells carry oxygen and are used for blood loss replacement.  White blood cells fight against infection.  Platelets control bleeding.  Plasma helps clot blood.  Other blood products are available for specialized needs, such as hemophilia or other clotting disorders. BEFORE THE TRANSFUSION  Who gives blood for transfusions?   Healthy volunteers who are fully evaluated to make sure their blood is safe. This  is blood bank blood. Transfusion therapy is the safest it has ever been in the practice of medicine. Before blood is taken from a donor, a complete history is taken to make sure that person has no history of diseases nor engages in risky social behavior (examples are intravenous drug use or sexual activity with multiple partners). The donor's travel history is screened to minimize risk of transmitting infections, such as malaria. The donated blood is tested for signs of infectious diseases, such as HIV and hepatitis. The blood is then tested to be sure it is compatible with you in order to minimize the chance of a transfusion reaction. If you or a relative donates blood, this is often done in anticipation of surgery and is not appropriate for emergency situations. It takes many days to process the donated blood. RISKS AND COMPLICATIONS Although transfusion therapy is very safe and saves many lives, the main dangers of transfusion include:   Getting an infectious disease.  Developing a transfusion reaction. This is an allergic reaction to something in the blood you were given. Every precaution is taken to prevent this. The decision to have a blood transfusion  has been considered carefully by your caregiver before blood is given. Blood is not given unless the benefits outweigh the risks. AFTER THE TRANSFUSION  Right after receiving a blood transfusion, you will usually feel much better and more energetic. This is especially true if your red blood cells have gotten low (anemic). The transfusion raises the level of the red blood cells which carry oxygen, and this usually causes an energy increase.  The nurse administering the transfusion will monitor you carefully for complications. HOME CARE INSTRUCTIONS  No special instructions are needed after a transfusion. You may find your energy is better. Speak with your caregiver about any limitations on activity for underlying diseases you may have. SEEK  MEDICAL CARE IF:   Your condition is not improving after your transfusion.  You develop redness or irritation at the intravenous (IV) site. SEEK IMMEDIATE MEDICAL CARE IF:  Any of the following symptoms occur over the next 12 hours:  Shaking chills.  You have a temperature by mouth above 102 F (38.9 C), not controlled by medicine.  Chest, back, or muscle pain.  People around you feel you are not acting correctly or are confused.  Shortness of breath or difficulty breathing.  Dizziness and fainting.  You get a rash or develop hives.  You have a decrease in urine output.  Your urine turns a dark color or changes to pink, red, or brown. Any of the following symptoms occur over the next 10 days:  You have a temperature by mouth above 102 F (38.9 C), not controlled by medicine.  Shortness of breath.  Weakness after normal activity.  The white part of the eye turns yellow (jaundice).  You have a decrease in the amount of urine or are urinating less often.  Your urine turns a dark color or changes to pink, red, or brown. Document Released: 03/19/2000 Document Revised: 06/14/2011 Document Reviewed: 11/06/2007 ExitCare Patient Information 2014 ExitCare, [place].  _______________________________________________________________________   CLEAR LIQUID DIET   Foods Allowed                                                                     Foods Excluded  Coffee and tea, regular and decaf                             liquids that you cannot  Plain Jell-O in any flavor                                             see through such as: Fruit ices (not with fruit pulp)                                     milk, soups, orange juice  Iced Popsicles                                    All solid food Carbonated beverages, regular and diet  Cranberry, grape and apple juices Sports drinks like Gatorade Lightly seasoned clear broth or consume(fat  free) Sugar, honey syrup  Sample Menu Breakfast                                Lunch                                     Supper Cranberry juice                    Beef broth                            Chicken broth Jell-O                                     Grape juice                           Apple juice Coffee or tea                        Jell-O                                      Popsicle                                                Coffee or tea                        Coffee or tea  _____________________________________________________________________

## 2014-08-06 LAB — HEMOGLOBIN A1C
Hgb A1c MFr Bld: 5.9 % — ABNORMAL HIGH (ref 4.8–5.6)
Mean Plasma Glucose: 123 mg/dL

## 2014-08-07 MED ORDER — GENTAMICIN SULFATE 40 MG/ML IJ SOLN
5.0000 mg/kg | INTRAVENOUS | Status: AC
  Administered 2014-08-08: 420 mg via INTRAVENOUS
  Filled 2014-08-07: qty 10.5

## 2014-08-07 MED ORDER — CLINDAMYCIN PHOSPHATE 900 MG/50ML IV SOLN
900.0000 mg | INTRAVENOUS | Status: AC
  Administered 2014-08-08: 900 mg via INTRAVENOUS

## 2014-08-08 ENCOUNTER — Inpatient Hospital Stay (HOSPITAL_COMMUNITY): Payer: 59 | Admitting: Anesthesiology

## 2014-08-08 ENCOUNTER — Encounter (HOSPITAL_COMMUNITY)
Admission: RE | Disposition: A | Discharge status: Home / Self Care | Payer: Self-pay | Source: Ambulatory Visit | Attending: General Surgery

## 2014-08-08 ENCOUNTER — Encounter (HOSPITAL_COMMUNITY): Payer: Self-pay | Admitting: Anesthesiology

## 2014-08-08 ENCOUNTER — Inpatient Hospital Stay (HOSPITAL_COMMUNITY)
Admission: RE | Admit: 2014-08-08 | Discharge: 2014-08-20 | DRG: 354 | Disposition: A | Discharge status: Home / Self Care | Payer: 59 | Source: Ambulatory Visit | Attending: General Surgery | Admitting: General Surgery

## 2014-08-08 DIAGNOSIS — Z833 Family history of diabetes mellitus: Secondary | ICD-10-CM

## 2014-08-08 DIAGNOSIS — C182 Malignant neoplasm of ascending colon: Secondary | ICD-10-CM | POA: Diagnosis present

## 2014-08-08 DIAGNOSIS — I10 Essential (primary) hypertension: Secondary | ICD-10-CM | POA: Diagnosis present

## 2014-08-08 DIAGNOSIS — K567 Ileus, unspecified: Secondary | ICD-10-CM | POA: Diagnosis not present

## 2014-08-08 DIAGNOSIS — D649 Anemia, unspecified: Secondary | ICD-10-CM | POA: Diagnosis present

## 2014-08-08 DIAGNOSIS — C189 Malignant neoplasm of colon, unspecified: Secondary | ICD-10-CM | POA: Diagnosis present

## 2014-08-08 DIAGNOSIS — Z01812 Encounter for preprocedural laboratory examination: Secondary | ICD-10-CM | POA: Diagnosis not present

## 2014-08-08 DIAGNOSIS — K9189 Other postprocedural complications and disorders of digestive system: Secondary | ICD-10-CM

## 2014-08-08 DIAGNOSIS — K429 Umbilical hernia without obstruction or gangrene: Secondary | ICD-10-CM | POA: Diagnosis present

## 2014-08-08 HISTORY — PX: UMBILICAL HERNIA REPAIR: SHX196

## 2014-08-08 LAB — TYPE AND SCREEN
ABO/RH(D): AB POS
Antibody Screen: NEGATIVE

## 2014-08-08 SURGERY — COLECTOMY, PARTIAL, ROBOT-ASSISTED, LAPAROSCOPIC
Anesthesia: General | Site: Abdomen

## 2014-08-08 MED ORDER — SUFENTANIL CITRATE 50 MCG/ML IV SOLN
INTRAVENOUS | Status: DC | PRN
  Administered 2014-08-08: 15 ug via INTRAVENOUS
  Administered 2014-08-08: 10 ug via INTRAVENOUS
  Administered 2014-08-08: 5 ug via INTRAVENOUS
  Administered 2014-08-08: 10 ug via INTRAVENOUS

## 2014-08-08 MED ORDER — LISINOPRIL-HYDROCHLOROTHIAZIDE 10-12.5 MG PO TABS: 0.5000 | ORAL_TABLET | Freq: Every morning | ORAL | Status: DC

## 2014-08-08 MED ORDER — ALVIMOPAN 12 MG PO CAPS
12.0000 mg | ORAL_CAPSULE | Freq: Two times a day (BID) | ORAL | Status: DC
  Administered 2014-08-09: 12 mg via ORAL
  Filled 2014-08-08 (×2): qty 1

## 2014-08-08 MED ORDER — LIDOCAINE HCL (CARDIAC) 20 MG/ML IV SOLN
INTRAVENOUS | Status: DC | PRN
Start: 2014-08-08 — End: 2014-08-08
  Administered 2014-08-08: 50 mg via INTRAVENOUS

## 2014-08-08 MED ORDER — ENOXAPARIN SODIUM 40 MG/0.4ML ~~LOC~~ SOLN
40.0000 mg | SUBCUTANEOUS | Status: DC
  Administered 2014-08-09 – 2014-08-20 (×12): 40 mg via SUBCUTANEOUS
  Filled 2014-08-08 (×14): qty 0.4

## 2014-08-08 MED ORDER — GLYCOPYRROLATE 0.2 MG/ML IJ SOLN
INTRAMUSCULAR | Status: DC | PRN
  Administered 2014-08-08: .8 mg via INTRAVENOUS

## 2014-08-08 MED ORDER — ONDANSETRON HCL 4 MG PO TABS
4.0000 mg | ORAL_TABLET | Freq: Four times a day (QID) | ORAL | Status: DC | PRN
  Administered 2014-08-14: 4 mg via ORAL
  Filled 2014-08-08: qty 1

## 2014-08-08 MED ORDER — LIDOCAINE HCL (CARDIAC) 20 MG/ML IV SOLN
INTRAVENOUS | Status: AC
  Filled 2014-08-08: qty 5

## 2014-08-08 MED ORDER — ROCURONIUM BROMIDE 100 MG/10ML IV SOLN
INTRAVENOUS | Status: AC
  Filled 2014-08-08: qty 1

## 2014-08-08 MED ORDER — NEOSTIGMINE METHYLSULFATE 10 MG/10ML IV SOLN
INTRAVENOUS | Status: DC | PRN
  Administered 2014-08-08: 5 mg via INTRAVENOUS

## 2014-08-08 MED ORDER — 0.9 % SODIUM CHLORIDE (POUR BTL) OPTIME
TOPICAL | Status: DC | PRN
  Administered 2014-08-08: 2000 mL

## 2014-08-08 MED ORDER — PROPOFOL 10 MG/ML IV BOLUS
INTRAVENOUS | Status: AC
  Filled 2014-08-08: qty 20

## 2014-08-08 MED ORDER — LACTATED RINGERS IV SOLN
INTRAVENOUS | Status: DC
  Administered 2014-08-08: 1000 mL via INTRAVENOUS

## 2014-08-08 MED ORDER — ONDANSETRON HCL 4 MG/2ML IJ SOLN
INTRAMUSCULAR | Status: DC | PRN
  Administered 2014-08-08: 4 mg via INTRAVENOUS

## 2014-08-08 MED ORDER — METOCLOPRAMIDE HCL 5 MG/ML IJ SOLN
INTRAMUSCULAR | Status: DC | PRN
  Administered 2014-08-08: 10 mg via INTRAVENOUS

## 2014-08-08 MED ORDER — OXYCODONE-ACETAMINOPHEN 5-325 MG PO TABS
1.0000 | ORAL_TABLET | ORAL | Status: DC | PRN
  Administered 2014-08-09 – 2014-08-13 (×10): 2 via ORAL
  Filled 2014-08-08 (×12): qty 2

## 2014-08-08 MED ORDER — ROCURONIUM BROMIDE 100 MG/10ML IV SOLN
INTRAVENOUS | Status: DC | PRN
  Administered 2014-08-08: 15 mg via INTRAVENOUS
  Administered 2014-08-08 (×2): 10 mg via INTRAVENOUS
  Administered 2014-08-08: 15 mg via INTRAVENOUS
  Administered 2014-08-08: 50 mg via INTRAVENOUS

## 2014-08-08 MED ORDER — LACTATED RINGERS IV SOLN
INTRAVENOUS | Status: DC | PRN
  Administered 2014-08-08 (×2): via INTRAVENOUS

## 2014-08-08 MED ORDER — BUPIVACAINE-EPINEPHRINE 0.25% -1:200000 IJ SOLN
INTRAMUSCULAR | Status: DC | PRN
  Administered 2014-08-08: 15 mL

## 2014-08-08 MED ORDER — DIPHENHYDRAMINE HCL 50 MG/ML IJ SOLN: 12.5000 mg | Freq: Four times a day (QID) | INTRAMUSCULAR | Status: DC | PRN

## 2014-08-08 MED ORDER — MIDAZOLAM HCL 2 MG/2ML IJ SOLN
INTRAMUSCULAR | Status: AC
  Filled 2014-08-08: qty 2

## 2014-08-08 MED ORDER — ONDANSETRON HCL 4 MG/2ML IJ SOLN
INTRAMUSCULAR | Status: AC
  Filled 2014-08-08: qty 2

## 2014-08-08 MED ORDER — BUPIVACAINE-EPINEPHRINE (PF) 0.25% -1:200000 IJ SOLN
INTRAMUSCULAR | Status: AC
  Filled 2014-08-08: qty 30

## 2014-08-08 MED ORDER — SUFENTANIL CITRATE 50 MCG/ML IV SOLN
INTRAVENOUS | Status: AC
  Filled 2014-08-08: qty 1

## 2014-08-08 MED ORDER — LACTATED RINGERS IV SOLN
INTRAVENOUS | Status: DC
  Administered 2014-08-08 – 2014-08-09 (×2): via INTRAVENOUS

## 2014-08-08 MED ORDER — HYDROMORPHONE HCL 1 MG/ML IJ SOLN
INTRAMUSCULAR | Status: AC
  Filled 2014-08-08: qty 1

## 2014-08-08 MED ORDER — LISINOPRIL 5 MG PO TABS
5.0000 mg | ORAL_TABLET | Freq: Every day | ORAL | Status: DC
  Administered 2014-08-09 – 2014-08-20 (×12): 5 mg via ORAL
  Filled 2014-08-08 (×12): qty 1

## 2014-08-08 MED ORDER — HYDROMORPHONE HCL 1 MG/ML IJ SOLN
0.2500 mg | INTRAMUSCULAR | Status: DC | PRN
  Administered 2014-08-08: 0.25 mg via INTRAVENOUS
  Administered 2014-08-08: 0.5 mg via INTRAVENOUS
  Administered 2014-08-08 (×2): 0.25 mg via INTRAVENOUS
  Administered 2014-08-08: 0.5 mg via INTRAVENOUS
  Administered 2014-08-08: 0.25 mg via INTRAVENOUS

## 2014-08-08 MED ORDER — MORPHINE SULFATE 2 MG/ML IJ SOLN
2.0000 mg | INTRAMUSCULAR | Status: DC | PRN
  Administered 2014-08-08: 2 mg via INTRAVENOUS
  Administered 2014-08-08: 4 mg via INTRAVENOUS
  Administered 2014-08-09 (×2): 2 mg via INTRAVENOUS
  Administered 2014-08-09: 4 mg via INTRAVENOUS
  Administered 2014-08-10: 2 mg via INTRAVENOUS
  Filled 2014-08-08: qty 2
  Filled 2014-08-08: qty 1
  Filled 2014-08-08: qty 2
  Filled 2014-08-08 (×3): qty 1

## 2014-08-08 MED ORDER — ONDANSETRON HCL 4 MG/2ML IJ SOLN
4.0000 mg | Freq: Four times a day (QID) | INTRAMUSCULAR | Status: DC | PRN
  Administered 2014-08-10 – 2014-08-11 (×2): 4 mg via INTRAVENOUS
  Filled 2014-08-08 (×2): qty 2

## 2014-08-08 MED ORDER — PROPOFOL 10 MG/ML IV BOLUS
INTRAVENOUS | Status: DC | PRN
  Administered 2014-08-08: 170 mg via INTRAVENOUS

## 2014-08-08 MED ORDER — ACETAMINOPHEN 500 MG PO TABS
1000.0000 mg | ORAL_TABLET | Freq: Four times a day (QID) | ORAL | Status: AC
  Filled 2014-08-08: qty 2

## 2014-08-08 MED ORDER — LACTATED RINGERS IR SOLN
Status: DC | PRN
  Administered 2014-08-08: 1000 mL

## 2014-08-08 MED ORDER — HYDROCHLOROTHIAZIDE 10 MG/ML ORAL SUSPENSION
6.2500 mg | Freq: Every day | ORAL | Status: DC
  Administered 2014-08-09 – 2014-08-20 (×12): 6.25 mg via ORAL
  Filled 2014-08-08 (×13): qty 1.25

## 2014-08-08 MED ORDER — DIPHENHYDRAMINE HCL 12.5 MG/5ML PO ELIX: 12.5000 mg | ORAL_SOLUTION | Freq: Four times a day (QID) | ORAL | Status: DC | PRN

## 2014-08-08 MED ORDER — CLINDAMYCIN PHOSPHATE 900 MG/50ML IV SOLN
900.0000 mg | Freq: Three times a day (TID) | INTRAVENOUS | Status: AC
  Administered 2014-08-08: 900 mg via INTRAVENOUS
  Filled 2014-08-08: qty 50

## 2014-08-08 MED ORDER — DEXAMETHASONE SODIUM PHOSPHATE 10 MG/ML IJ SOLN
INTRAMUSCULAR | Status: DC | PRN
  Administered 2014-08-08: 10 mg via INTRAVENOUS

## 2014-08-08 MED ORDER — CLINDAMYCIN PHOSPHATE 900 MG/50ML IV SOLN
INTRAVENOUS | Status: AC
  Filled 2014-08-08: qty 50

## 2014-08-08 MED ORDER — MIDAZOLAM HCL 5 MG/5ML IJ SOLN
INTRAMUSCULAR | Status: DC | PRN
  Administered 2014-08-08: 2 mg via INTRAVENOUS

## 2014-08-08 MED ORDER — ALVIMOPAN 12 MG PO CAPS
12.0000 mg | ORAL_CAPSULE | Freq: Once | ORAL | Status: AC
Start: 2014-08-08 — End: 2014-08-08
  Administered 2014-08-08: 12 mg via ORAL
  Filled 2014-08-08: qty 1

## 2014-08-08 SURGICAL SUPPLY — 89 items
BLADE EXTENDED COATED 6.5IN (ELECTRODE) ×2 IMPLANT
BLADE SURG SZ11 CARB STEEL (BLADE) ×4 IMPLANT
CANNULA REDUC XI 12-8 STAPL (CANNULA) ×1
CANNULA REDUC XI 12-8MM STAPL (CANNULA) ×1
CANNULA REDUCER 12-8 DVNC XI (CANNULA) ×2 IMPLANT
CELLS DAT CNTRL 66122 CELL SVR (MISCELLANEOUS) IMPLANT
CLIP LIGATING HEM O LOK PURPLE (MISCELLANEOUS) IMPLANT
CLIP LIGATING HEMOLOK MED (MISCELLANEOUS) IMPLANT
COUNTER NEEDLE 20 DBL MAG RED (NEEDLE) ×2 IMPLANT
COVER TIP SHEARS 8 DVNC (MISCELLANEOUS) ×2 IMPLANT
COVER TIP SHEARS 8MM DA VINCI (MISCELLANEOUS) ×2
DECANTER SPIKE VIAL GLASS SM (MISCELLANEOUS) ×4 IMPLANT
DEVICE TROCAR PUNCTURE CLOSURE (ENDOMECHANICALS) ×4 IMPLANT
DRAPE ARM DVNC X/XI (DISPOSABLE) ×8 IMPLANT
DRAPE COLUMN DVNC XI (DISPOSABLE) ×2 IMPLANT
DRAPE DA VINCI XI ARM (DISPOSABLE) ×10
DRAPE DA VINCI XI COLUMN (DISPOSABLE) ×2
DRAPE SURG IRRIG POUCH 19X23 (DRAPES) ×4 IMPLANT
DRSG OPSITE POSTOP 4X10 (GAUZE/BANDAGES/DRESSINGS) IMPLANT
DRSG OPSITE POSTOP 4X6 (GAUZE/BANDAGES/DRESSINGS) ×2 IMPLANT
DRSG OPSITE POSTOP 4X8 (GAUZE/BANDAGES/DRESSINGS) IMPLANT
ELECT PENCIL ROCKER SW 15FT (MISCELLANEOUS) ×4 IMPLANT
ELECT REM PT RETURN 15FT ADLT (MISCELLANEOUS) ×2 IMPLANT
ENDOLOOP SUT PDS II  0 18 (SUTURE)
ENDOLOOP SUT PDS II 0 18 (SUTURE) IMPLANT
EVACUATOR SILICONE 100CC (DRAIN) IMPLANT
GAUZE SPONGE 4X4 12PLY STRL (GAUZE/BANDAGES/DRESSINGS) IMPLANT
GLOVE BIO SURGEON STRL SZ 6.5 (GLOVE) ×9 IMPLANT
GLOVE BIO SURGEONS STRL SZ 6.5 (GLOVE) ×3
GLOVE BIOGEL PI IND STRL 7.0 (GLOVE) ×6 IMPLANT
GLOVE BIOGEL PI INDICATOR 7.0 (GLOVE) ×6
GOWN STRL REUS W/TWL 2XL LVL3 (GOWN DISPOSABLE) ×10 IMPLANT
GOWN STRL REUS W/TWL XL LVL3 (GOWN DISPOSABLE) ×16 IMPLANT
HOLDER FOLEY CATH W/STRAP (MISCELLANEOUS) ×4 IMPLANT
LEGGING LITHOTOMY PAIR STRL (DRAPES) ×2 IMPLANT
LIQUID BAND (GAUZE/BANDAGES/DRESSINGS) ×2 IMPLANT
NDL INSUFFLATION 14GA 120MM (NEEDLE) ×2 IMPLANT
NEEDLE INSUFFLATION 14GA 120MM (NEEDLE) ×4 IMPLANT
PACK CARDIOVASCULAR III (CUSTOM PROCEDURE TRAY) ×4 IMPLANT
PACK COLON (CUSTOM PROCEDURE TRAY) ×4 IMPLANT
PACK GENERAL/GYN (CUSTOM PROCEDURE TRAY) ×4 IMPLANT
PORT LAP GEL ALEXIS MED 5-9CM (MISCELLANEOUS) ×2 IMPLANT
RELOAD STAPLE 45 BLU REG DVNC (STAPLE) IMPLANT
RELOAD STAPLE 45 GRN THCK DVNC (STAPLE) IMPLANT
RETRACTOR WND ALEXIS 18 MED (MISCELLANEOUS) IMPLANT
RTRCTR WOUND ALEXIS 18CM MED (MISCELLANEOUS)
SCISSORS LAP 5X45 EPIX DISP (ENDOMECHANICALS) ×4 IMPLANT
SEAL CANN UNIV 5-8 DVNC XI (MISCELLANEOUS) ×6 IMPLANT
SEAL XI 5MM-8MM UNIVERSAL (MISCELLANEOUS) ×6
SEALER VESSEL DA VINCI XI (MISCELLANEOUS) ×2
SEALER VESSEL EXT DVNC XI (MISCELLANEOUS) ×2 IMPLANT
SET IRRIG TUBING LAPAROSCOPIC (IRRIGATION / IRRIGATOR) ×4 IMPLANT
SLEEVE XCEL OPT CAN 5 100 (ENDOMECHANICALS) IMPLANT
SOLUTION ELECTROLUBE (MISCELLANEOUS) ×2 IMPLANT
STAPLER 45 BLU RELOAD XI (STAPLE) ×8 IMPLANT
STAPLER 45 BLUE RELOAD XI (STAPLE) ×8
STAPLER 45 GREEN RELOAD XI (STAPLE)
STAPLER 45 GRN RELOAD XI (STAPLE) IMPLANT
STAPLER CANNULA SEAL DVNC XI (STAPLE) IMPLANT
STAPLER CANNULA SEAL XI (STAPLE)
STAPLER SHEATH (SHEATH)
STAPLER SHEATH ENDOWRIST DVNC (SHEATH) IMPLANT
STAPLER VISISTAT 35W (STAPLE) ×2 IMPLANT
SUT NOVA NAB DX-16 0-1 5-0 T12 (SUTURE) ×4 IMPLANT
SUT PDS AB 1 CTX 36 (SUTURE) ×2 IMPLANT
SUT PDS AB 1 TP1 96 (SUTURE) IMPLANT
SUT PROLENE 2 0 KS (SUTURE) ×4 IMPLANT
SUT SILK 2 0 (SUTURE) ×4
SUT SILK 2 0 SH CR/8 (SUTURE) ×4 IMPLANT
SUT SILK 2-0 18XBRD TIE 12 (SUTURE) ×2 IMPLANT
SUT SILK 3 0 (SUTURE) ×4
SUT SILK 3 0 SH CR/8 (SUTURE) ×4 IMPLANT
SUT SILK 3-0 18XBRD TIE 12 (SUTURE) ×2 IMPLANT
SUT V-LOC BARB 180 2/0GR6 GS22 (SUTURE) ×8
SUT VIC AB 2-0 SH 18 (SUTURE) IMPLANT
SUT VIC AB 2-0 SH 27 (SUTURE)
SUT VIC AB 2-0 SH 27X BRD (SUTURE) IMPLANT
SUT VIC AB 4-0 PS2 27 (SUTURE) ×12 IMPLANT
SUT VICRYL 0 UR6 27IN ABS (SUTURE) ×2 IMPLANT
SUTURE V-LC BRB 180 2/0GR6GS22 (SUTURE) IMPLANT
SYRINGE 10CC LL (SYRINGE) ×4 IMPLANT
SYS LAPSCP GELPORT 120MM (MISCELLANEOUS)
SYSTEM LAPSCP GELPORT 120MM (MISCELLANEOUS) IMPLANT
TOWEL OR NON WOVEN STRL DISP B (DISPOSABLE) ×4 IMPLANT
TRAY FOLEY W/METER SILVER 14FR (SET/KITS/TRAYS/PACK) ×4 IMPLANT
TROCAR BLADELESS OPT 5 100 (ENDOMECHANICALS) ×4 IMPLANT
TUBING CONNECTING 10 (TUBING) IMPLANT
TUBING CONNECTING 10' (TUBING)
TUBING FILTER THERMOFLATOR (ELECTROSURGICAL) ×4 IMPLANT

## 2014-08-08 NOTE — Anesthesia Preprocedure Evaluation (Addendum)
Anesthesia Evaluation  Patient identified by MRN, date of birth, ID band Patient awake    Reviewed: Allergy & Precautions, NPO status , Patient's Chart, lab work & pertinent test results  History of Anesthesia Complications (+) history of anesthetic complications  Airway Mallampati: II  TM Distance: >3 FB Neck ROM: Full    Dental no notable dental hx.    Pulmonary neg pulmonary ROS,  breath sounds clear to auscultation  Pulmonary exam normal       Cardiovascular Exercise Tolerance: Good hypertension, Pt. on medications Normal cardiovascular examRhythm:Regular Rate:Normal     Neuro/Psych negative neurological ROS  negative psych ROS   GI/Hepatic negative GI ROS, Neg liver ROS,   Endo/Other  negative endocrine ROS  Renal/GU negative Renal ROS  negative genitourinary   Musculoskeletal negative musculoskeletal ROS (+)   Abdominal   Peds negative pediatric ROS (+)  Hematology negative hematology ROS (+) anemia ,   Anesthesia Other Findings   Reproductive/Obstetrics negative OB ROS                            Anesthesia Physical Anesthesia Plan  ASA: II  Anesthesia Plan: General   Post-op Pain Management:    Induction: Intravenous  Airway Management Planned: Oral ETT  Additional Equipment:   Intra-op Plan:   Post-operative Plan: Extubation in OR  Informed Consent: I have reviewed the patients History and Physical, chart, labs and discussed the procedure including the risks, benefits and alternatives for the proposed anesthesia with the patient or authorized representative who has indicated his/her understanding and acceptance.   Dental advisory given  Plan Discussed with: CRNA  Anesthesia Plan Comments:         Anesthesia Quick Evaluation

## 2014-08-08 NOTE — H&P (View-Only) (Signed)
Mark Owen 07/26/2014 2:00 PM Location: [facility] Patient #: [id number] DOB: May 26, 1950 Married / Language: English / Race: Black or African American Male History of Present Illness Mark Ruff MD; 2/42/3536 3:18 PM) Patient words: colon cancer.  The patient is a 64 year old male who presents with colorectal cancer. 64 year old male who presented with chronic anemia. He underwent a colonoscopy which showed a mass in his hepatic flexure. Biopsies confirm adenocarcinoma. CT scans of chest abdomen and pelvis showed no signs of metastatic disease. The mass is tattooed proximally and distally. It is clearly seen at the hepatic flexure on CT scan. Patient denies any changes in his bowel habits. He is currently taking iron on a twice a day basis. He denies any weight loss or abdominal pain. Other Problems Mark Owen, CMA; 07/26/2014 2:01 PM) Back Pain Colon Cancer High blood pressure  Past Surgical History Mark Owen, CMA; 07/26/2014 2:01 PM) Colon Polyp Removal - Colonoscopy Colon Polyp Removal - Open Knee Surgery Right. Oral Surgery Shoulder Surgery Bilateral.  Diagnostic Studies History Mark Owen, CMA; 07/26/2014 2:01 PM) Colonoscopy within last year  Allergies Mark Owen, Ducor; 07/26/2014 2:03 PM) Penicillamine *ASSORTED CLASSES*  Medication History Mark Owen, CMA; 07/26/2014 2:05 PM) Lisinopril-Hydrochlorothiazide (10-12.5MG  Tablet, Oral) Active. Iron (65MG  Tablet, Oral) Active. Multivitamins (Oral) Active. Medications Reconciled  Social History Mark Owen, CMA; 07/26/2014 2:01 PM) Caffeine use Carbonated beverages, Coffee, Tea. No alcohol use No drug use Tobacco use Never smoker.  Family History Mark Owen, Mark Owen; 07/26/2014 2:01 PM) Alcohol Abuse Father. Diabetes Mellitus Father. Hypertension Family Members In General, Father. Kidney Disease Father.     Review of Systems ([place]; 07/26/2014 2:01  PM) General Present- Fatigue. Not Present- Appetite Loss, Chills, Fever, Night Sweats, Weight Gain and Weight Loss. Skin Present- Dryness. Not Present- Change in Wart/Mole, Hives, Jaundice, New Lesions, Non-Healing Wounds, Rash and Ulcer. HEENT Present- Wears glasses/contact lenses. Not Present- Earache, Hearing Loss, Hoarseness, Nose Bleed, Oral Ulcers, Ringing in the Ears, Seasonal Allergies, Sinus Pain, Sore Throat, Visual Disturbances and Yellow Eyes. Respiratory Not Present- Bloody sputum, Chronic Cough, Difficulty Breathing, Snoring and Wheezing. Breast Not Present- Breast Mass, Breast Pain, Nipple Discharge and Skin Changes. Cardiovascular Present- Leg Cramps. Not Present- Chest Pain, Difficulty Breathing Lying Down, Palpitations, Rapid Heart Rate, Shortness of Breath and Swelling of Extremities. Gastrointestinal Present- Excessive gas. Not Present- Abdominal Pain, Bloating, Bloody Stool, Change in Bowel Habits, Chronic diarrhea, Constipation, Difficulty Swallowing, Gets full quickly at meals, Hemorrhoids, Indigestion, Nausea, Rectal Pain and Vomiting. Male Genitourinary Not Present- Blood in Urine, Change in Urinary Stream, Frequency, Impotence, Nocturia, Painful Urination, Urgency and Urine Leakage. Musculoskeletal Present- Back Pain. Not Present- Joint Pain, Joint Stiffness, Muscle Pain, Muscle Weakness and Swelling of Extremities. Neurological Not Present- Decreased Memory, Fainting, Headaches, Numbness, Seizures, Tingling, Tremor, Trouble walking and Weakness. Psychiatric Not Present- Anxiety, Bipolar, Change in Sleep Pattern, Depression, Fearful and Frequent crying. Endocrine Not Present- Cold Intolerance, Excessive Hunger, Hair Changes, Heat Intolerance, Hot flashes and New Diabetes. Hematology Not Present- Easy Bruising, Excessive bleeding, Gland problems, HIV and Persistent Infections.  Vitals (Sonya Owen CMA; 07/26/2014 2:02 PM) 07/26/2014 2:01 PM Weight: 185.2 lb Height:  72in Body Surface Area: 2.07 m Body Mass Index: 25.12 kg/m Temp.: 97.9F(Temporal)  Pulse: 77 (Regular)  BP: 128/78 (Sitting, Left Arm, Standard)     Physical Exam Mark Ruff MD; 1/44/3154 3:19 PM)  General Mental Status-Alert. General Appearance-Consistent with stated age. Hydration-Well hydrated. Voice-Normal.  Head and Neck Head-normocephalic, atraumatic with no lesions or  palpable masses. Trachea-midline. Thyroid Gland Characteristics - normal size and consistency.  Eye Eyeball - Bilateral-Extraocular movements intact. Sclera/Conjunctiva - Bilateral-No scleral icterus.  Chest and Lung Exam Chest and lung exam reveals -quiet, even and easy respiratory effort with no use of accessory muscles and on auscultation, normal breath sounds, no adventitious sounds and normal vocal resonance. Inspection Chest Wall - Normal. Back - normal.  Cardiovascular Cardiovascular examination reveals -normal heart sounds, regular rate and rhythm with no murmurs and normal pedal pulses bilaterally.  Abdomen Inspection Inspection of the abdomen reveals - No Hernias. Palpation/Percussion Palpation and Percussion of the abdomen reveal - Soft, Non Tender, No Rebound tenderness, No Rigidity (guarding) and No hepatosplenomegaly. Auscultation Auscultation of the abdomen reveals - Bowel sounds normal.  Neurologic Neurologic evaluation reveals -alert and oriented x 3 with no impairment of recent or remote memory. Mental Status-Normal.  Musculoskeletal Global Assessment -Note:no gross deformities.  Normal Exam - Left-Upper Extremity Strength Normal and Lower Extremity Strength Normal. Normal Exam - Right-Upper Extremity Strength Normal and Lower Extremity Strength Normal.    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 Hepatic flexure cancer was noted. This was tattooed distally and proximally. CT scan of the chest and pelvis showed no signs of metastatic disease. His CEA level is pending. I have recommended that he undergo minimally invasive right hemicolectomy.  The surgery and anatomy were described to the patient as well as the risks of surgery and the possible complications. These include: Bleeding, deep abdominal infections and possible wound complications such as hernia and infection, damage to adjacent structures, leak of surgical connections, which can lead to other surgeries and possibly an ostomy, possible need for other procedures, such as abscess drains in radiology, possible prolonged hospital stay, possible diarrhea from removal of part of the colon, possible constipation from narcotics, possible bowel, bladder or sexual dysfunction if having rectal surgery, prolonged fatigue/weakness or appetite loss, possible early recurrence of of disease, possible complications of their medical problems such as heart disease or arrhythmias or lung problems, death (less than 1%). I believe the patient understands and wishes to proceed with the surgery.  Current Plans Pt Education - [facility] Colon Bowel Prep 2015 Miralax/Antibiotics Started Neomycin Sulfate 500MG , 2 (two) Tablet SEE NOTE, #6, 07/26/2014, No Refill. Local Order: TAKE TWO TABLETS AT 2 PM, 3 PM, AND 10 PM THE DAY PRIOR TO SURGERY Started Flagyl 500MG , 2 (two) Tablet SEE NOTE, #6, 07/26/2014, No Refill. Local Order: Take at 2pm, 3pm, and 10pm the day prior to your colon operation Pt Education - [facility] Colorectal Cancer (AT)

## 2014-08-08 NOTE — Transfer of Care (Signed)
Immediate Anesthesia Transfer of Care Note  Patient: Mark Owen  Procedure(s) Performed: Procedure(s): XI ROBOT ASSISTED LAPAROSCOPIC RIGHT COLECTOMY (N/A) HERNIA REPAIR UMBILICAL ADULT  Patient Location: PACU  Anesthesia Type:General  Level of Consciousness: awake, alert  and oriented  Airway & Oxygen Therapy: Patient Spontanous Breathing and Patient connected to face mask oxygen  Post-op Assessment: Report given to RN and Post -op Vital signs reviewed and stable  Post vital signs: Reviewed and stable  Last Vitals:  Filed Vitals:   08/08/14 1115  BP: 142/79  Pulse: 67  Temp: 36.4 C  Resp: 16    Complications: No apparent anesthesia complications

## 2014-08-08 NOTE — Op Note (Signed)
08/08/2014  4:41 PM  PATIENT:  Mark Owen  64 y.o. male  Patient Care Team: Josetta Huddle, MD as PCP - General (Internal Medicine)  PRE-OPERATIVE DIAGNOSIS:  colon cancer  POST-OPERATIVE DIAGNOSIS:  colon cancer  PROCEDURE:  XI ROBOT ASSISTED RIGHT COLECTOMY UMBILICAL HERNIA REPAIR ADULT   Surgeon(s): [name], MD  ASSISTANT: Emilee Hero PA ([employer])   ANESTHESIA:   general  EBL:  64ml Total I/O In: 1000 [I.V.:1000] Out: 125 [Urine:125]  SPECIMEN:  Source of Specimen:  R colon  DISPOSITION OF SPECIMEN:  PATHOLOGY  COUNTS:  YES  PLAN OF CARE: Admit to inpatient   PATIENT DISPOSITION:  PACU - hemodynamically stable.   INDICATIONS: 64 y.o. M with hepatic flexure mass seen on colonoscopy.  Biopsy shows adenocarcinoma.  CEA was 2.4.  CT scans show no evidence of metastatic disease. The risk and benefits and alternative treatments were explained to the patient prior to the OR and the patient has elected to proceed with a robotic assisted R colectomy.  Consent was signed and placed on chart prior to the OR.   OR FINDINGS:  R colon mass in area of tattoo at the hepatic flexure.  No signs of metastatic disease.  Normal appearing gallbladder  DESCRIPTION:  The patient was identified & brought into the operating room. The patient was positioned supine with both arms tucked. SCDs were active during the entire case. The patient underwent general anesthesia without any difficulty. A foley catheter was inserted under sterile conditions. The abdomen was prepped and draped in a sterile fashion. A Surgical Timeout confirmed our plan.  I placed the patient in reverse trendelenburg with left side up.  I placed a Varies needle in the LUQ without difficulty. We induced carbon dioxide insufflation. An 77mm robotic port was placed at the umbilicus.  Camera inspection revealed no injury at either site. I placed additional ports under direct laparoscopic visualization. I placed a 88mm  port in the LUQ and a 16mm assist port in the left lateral abdomen.  I evaluated the entire abdomen laparoscopically.  The liver appeared normal, the large and small bowel were normal as well.  There were no signs of metastatic disease.  The robot was docked to the patient's right side and instruments were placed under direct visualization.    I began by mobilizing the adhesions in the RUQ to free the cecum.  I then identified the ileocolic artery and vein within the mesentery. Dissection was bluntly carried around these structures. The duodenum was identified and free from the structures. I then separated the structures bluntly and used the robotic vessel sealer device to transect these separately.  I developed the retroperitoneal plane bluntly.  I then freed the appendix off its attachments to the pelvic wall. I mobilized the terminal ileum.  I took care to avoid injuring any retroperitoneal structures.  After this I began to mobilize laterally down the white line of Toldt and then took down the hepatic flexure using the Enseal device. I mobilized the omentum off of the right transverse colon. The entire colon was then flipped medially and mobilized off of the retroperitoneal structures until I could visualize the lateral edge of the duodenum underneath.  I gently freed the duodenal attachments.  I continued dissection of the mesentery to ~5cm distal to the distal tattoo.  I divided the right branch of the middle colic low with a the vessel sealer and then came up the mesentery to the transverse colon.  I divided the  transverse colon with 2 fires of a blue load robotic stapler.  I then divided the rest of the ileocolonic mesentery with the vessel sealer.  I divided the terminal ileum with a blue load stapler.   The colon was free at that time.  I brought the end of the terminal ileum and overlapped this with the remaining transverse colon.  I intentionally placed a small enterotomy in both the end of the terminal  ileum and the transverse colon ~5cm distal to the staple line.  A blue load 60mm stapler was used to create the anastomosis.  The common channel was closed with 2 2-0 V lock sutures.  The abdomen was inspected.  Hemostasis was good.  At that point, I made an enlargement of my umbilical incision using a scalpel. The fascia was then incised using electrocautery. An alexis wound protector was placed. The terminal ileum and right colon were then removed from the wound. The abdomen was then irrigated with normal saline. The omentum was then brought down over the anastomosis. The 12 mm port site was closed with a 0 Vicryl suture and an endoscopic suture passer.  The Alexis wound protector was then removed, and we switched to clean gloves.    The fascia was then closed using #0 Novafil interrupted sutures, closing his umbilical hernia.  Ports were removed. The skin was then closed using 4-0 Vicryl sutures. Dermabond was placed on the port sites and a sterile dressing was placed over the abdominal incision.  All counts were correct per operating room staff. The patient was then awakened from anesthesia and sent to the post anesthesia care unit in stable condition.

## 2014-08-08 NOTE — Interval H&P Note (Signed)
History and Physical Interval Note:  08/08/2014 12:50 PM  Mark Owen  has presented today for surgery, with the diagnosis of colon cancer  The various methods of treatment have been discussed with the patient and family. After consideration of risks, benefits and other options for treatment, the patient has consented to  Procedure(s): XI ROBOT ASSISTED LAPAROSCOPIC PARTIAL COLECTOMY (N/A) as a surgical intervention .  The patient's history has been reviewed, patient examined, no change in status, stable for surgery.  I have reviewed the patient's chart and labs.  Questions were answered to the patient's satisfaction.     Pt has a small umbilical hernia.  We will repair this as well during surgery.    Rosario Adie, MD  [facility]

## 2014-08-08 NOTE — Anesthesia Postprocedure Evaluation (Signed)
  Anesthesia Post-op Note  Patient: [name]  Procedure(s) Performed: Procedure(s) (LRB): XI ROBOT ASSISTED LAPAROSCOPIC RIGHT COLECTOMY (N/A) HERNIA REPAIR UMBILICAL ADULT  Patient Location: PACU  Anesthesia Type: General  Level of Consciousness: awake and alert   Airway and Oxygen Therapy: Patient Spontanous Breathing  Post-op Pain: mild  Post-op Assessment: Post-op Vital signs reviewed, Patient's Cardiovascular Status Stable, Respiratory Function Stable, Patent Airway and No signs of Nausea or vomiting  Last Vitals:  Filed Vitals:   08/08/14 1930  BP: 122/64  Pulse: 71  Temp: 36.5 C  Resp: 15    Post-op Vital Signs: stable   Complications: No apparent anesthesia complications

## 2014-08-08 NOTE — Anesthesia Procedure Notes (Signed)
Procedure Name: Intubation Date/Time: 08/08/2014 1:42 PM Performed by: Sherian Maroon A Pre-anesthesia Checklist: Patient identified, Emergency Drugs available, Suction available, Patient being monitored and Timeout performed Patient Re-evaluated:Patient Re-evaluated prior to inductionOxygen Delivery Method: Circle system utilized Preoxygenation: Pre-oxygenation with 100% oxygen Intubation Type: IV induction Ventilation: Mask ventilation without difficulty Grade View: Grade I Tube type: Oral Tube size: 8.0 mm Number of attempts: 1 Airway Equipment and Method: Stylet Placement Confirmation: ETT inserted through vocal cords under direct vision,  positive ETCO2 and breath sounds checked- equal and bilateral Secured at: 22 cm Tube secured with: Tape Dental Injury: Teeth and Oropharynx as per pre-operative assessment

## 2014-08-09 ENCOUNTER — Encounter (HOSPITAL_COMMUNITY): Payer: Self-pay | Admitting: General Surgery

## 2014-08-09 LAB — CBC
HEMATOCRIT: 34.1 % — AB (ref 39.0–52.0)
Hemoglobin: 10.9 g/dL — ABNORMAL LOW (ref 13.0–17.0)
MCH: 24.2 pg — ABNORMAL LOW (ref 26.0–34.0)
MCHC: 32 g/dL (ref 30.0–36.0)
MCV: 75.8 fL — AB (ref 78.0–100.0)
Platelets: 213 10*3/uL (ref 150–400)
RBC: 4.5 MIL/uL (ref 4.22–5.81)
RDW: 19.6 % — AB (ref 11.5–15.5)
WBC: 9.1 10*3/uL (ref 4.0–10.5)

## 2014-08-09 LAB — BASIC METABOLIC PANEL
Anion gap: 5 (ref 5–15)
BUN: 14 mg/dL (ref 6–20)
CALCIUM: 8.5 mg/dL — AB (ref 8.9–10.3)
CHLORIDE: 103 mmol/L (ref 101–111)
CO2: 23 mmol/L (ref 22–32)
CREATININE: 1.29 mg/dL — AB (ref 0.61–1.24)
GFR calc Af Amer: >60 mL/min (ref >60)
GFR calc non Af Amer: 58 mL/min — ABNORMAL LOW (ref >60)
GLUCOSE: 143 mg/dL — AB (ref 70–99)
Potassium: 4.4 mmol/L (ref 3.5–5.1)
Sodium: 131 mmol/L — ABNORMAL LOW (ref 135–145)

## 2014-08-09 MED ORDER — KCL IN DEXTROSE-NACL 20-5-0.45 MEQ/L-%-% IV SOLN
INTRAVENOUS | Status: DC
  Administered 2014-08-09 – 2014-08-12 (×6): via INTRAVENOUS
  Filled 2014-08-09 (×8): qty 1000

## 2014-08-09 MED ORDER — CETYLPYRIDINIUM CHLORIDE 0.05 % MT LIQD
7.0000 mL | Freq: Two times a day (BID) | OROMUCOSAL | Status: DC
  Administered 2014-08-09: 7 mL via OROMUCOSAL

## 2014-08-09 MED ORDER — LIP MEDEX EX OINT
TOPICAL_OINTMENT | CUTANEOUS | Status: AC
Start: 2014-08-09 — End: 2014-08-09
  Filled 2014-08-09: qty 7

## 2014-08-09 NOTE — Progress Notes (Signed)
1 Day Post-Op Robotic assisted R colectomy Subjective: Pt doing well.  Min pain.  No nausea  Objective: Vital signs in last 24 hours: Temp:  [97.6 F (36.4 C)-98.7 F (37.1 C)] 98.7 F (37.1 C) (05/06 0611) Pulse Rate:  [67-96] 69 (05/06 0611) Resp:  [10-22] 16 (05/06 0611) BP: (105-143)/(57-79) 105/57 mmHg (05/06 0611) SpO2:  [98 %-100 %] 100 % (05/06 0611) Weight:  [84.482 kg (186 lb 4 oz)] 84.482 kg (186 lb 4 oz) (05/05 1115)   Intake/Output from previous day: 05/05 0701 - 05/06 0700 In: 3328.3 [I.V.:3228.3; IV Piggyback:100] Out: 1320 [Urine:1300; Blood:20] Intake/Output this shift:     General appearance: alert and cooperative GI: soft, nontender  Incision: slight bloody drainage  Lab Results:   Recent Labs  08/09/14 0435  WBC 9.1  HGB 10.9*  HCT 34.1*  PLT 213   BMET  Recent Labs  08/09/14 0435  NA 131*  K 4.4  CL 103  CO2 23  GLUCOSE 143*  BUN 14  CREATININE 1.29*  CALCIUM 8.5*   PT/INR No results for input(s): LABPROT, INR in the last 72 hours. ABG No results for input(s): PHART, HCO3 in the last 72 hours.  Invalid input(s): PCO2, PO2  MEDS, Scheduled . acetaminophen  1,000 mg Oral 4 times per day  . alvimopan  12 mg Oral BID  . antiseptic oral rinse  7 mL Mouth Rinse BID  . enoxaparin (LOVENOX) injection  40 mg Subcutaneous Q24H  . lisinopril  5 mg Oral Daily   And  . hydrochlorothiazide  6.25 mg Oral Daily  . lip balm        Studies/Results: No results found.  Assessment: s/p Procedure(s): XI ROBOT ASSISTED LAPAROSCOPIC RIGHT COLECTOMY HERNIA REPAIR UMBILICAL ADULT Patient Active Problem List   Diagnosis Date Noted  . Colon cancer 08/08/2014    Expected post op course  Plan: d/c foley Advance diet to clears Ambulate Decrease MIV   LOS: 1 day     .Rosario Adie, [facility], [place]   08/09/2014 8:30 AM

## 2014-08-09 NOTE — Progress Notes (Signed)
Pharmacy Brief Note - Alvimopan (Entereg)  The standing order set for alvimopan (Entereg) now includes an automatic order to discontinue the drug after the patient has had a bowel movement. The change was approved by the [facility] and the Medical Executive Committee.   This patient has had bowel movements documented by nursing. Therefore, alvimopan has been discontinued. If there are questions, please contact the pharmacy at [phone].   Thank you-  Dolly Rias RPh 08/09/2014, 4:38 PM Pager [phone]

## 2014-08-10 LAB — BASIC METABOLIC PANEL
Anion gap: 5 (ref 5–15)
BUN: 16 mg/dL (ref 6–20)
CHLORIDE: 104 mmol/L (ref 101–111)
CO2: 25 mmol/L (ref 22–32)
Calcium: 9.1 mg/dL (ref 8.9–10.3)
Creatinine, Ser: 1.28 mg/dL — ABNORMAL HIGH (ref 0.61–1.24)
GFR calc Af Amer: >60 mL/min (ref >60)
GFR, EST NON AFRICAN AMERICAN: 58 mL/min — AB (ref >60)
GLUCOSE: 135 mg/dL — AB (ref 70–99)
POTASSIUM: 4.2 mmol/L (ref 3.5–5.1)
SODIUM: 134 mmol/L — AB (ref 135–145)

## 2014-08-10 LAB — CBC
HEMATOCRIT: 33 % — AB (ref 39.0–52.0)
HEMOGLOBIN: 10.5 g/dL — AB (ref 13.0–17.0)
MCH: 24.4 pg — AB (ref 26.0–34.0)
MCHC: 31.8 g/dL (ref 30.0–36.0)
MCV: 76.7 fL — AB (ref 78.0–100.0)
Platelets: 260 10*3/uL (ref 150–400)
RBC: 4.3 MIL/uL (ref 4.22–5.81)
RDW: 20.2 % — ABNORMAL HIGH (ref 11.5–15.5)
WBC: 10.4 10*3/uL (ref 4.0–10.5)

## 2014-08-10 NOTE — Progress Notes (Signed)
2 Days Post-Op Robotic assisted R colectomy Subjective: Pt doing well.  Min pain.  No nausea, feeling somewhat bloated  Objective: Vital signs in last 24 hours: Temp:  [97.5 F (36.4 C)-98.5 F (36.9 C)] 98.5 F (36.9 C) (05/07 0600) Pulse Rate:  [61-76] 69 (05/07 0600) Resp:  [15-18] 18 (05/07 0600) BP: (107-127)/(53-65) 127/58 mmHg (05/07 0600) SpO2:  [97 %-100 %] 98 % (05/07 0600)   Intake/Output from previous day: 05/06 0701 - 05/07 0700 In: 2382 [P.O.:812; I.V.:1570] Out: 2700 [Urine:2700] Intake/Output this shift: Total I/O In: 1140 [P.O.:240; I.V.:900] Out: 875 [Urine:875]   General appearance: alert and cooperative GI: soft, nontender  Incision: slight bloody drainage  Lab Results:   Recent Labs  08/09/14 0435 08/10/14 0406  WBC 9.1 10.4  HGB 10.9* 10.5*  HCT 34.1* 33.0*  PLT 213 260   BMET  Recent Labs  08/09/14 0435 08/10/14 0406  NA 131* 134*  K 4.4 4.2  CL 103 104  CO2 23 25  GLUCOSE 143* 135*  BUN 14 16  CREATININE 1.29* 1.28*  CALCIUM 8.5* 9.1   PT/INR No results for input(s): LABPROT, INR in the last 72 hours. ABG No results for input(s): PHART, HCO3 in the last 72 hours.  Invalid input(s): PCO2, PO2  MEDS, Scheduled . enoxaparin (LOVENOX) injection  40 mg Subcutaneous Q24H  . lisinopril  5 mg Oral Daily   And  . hydrochlorothiazide  6.25 mg Oral Daily    Studies/Results: No results found.  Assessment: s/p Procedure(s): XI ROBOT ASSISTED LAPAROSCOPIC RIGHT COLECTOMY HERNIA REPAIR UMBILICAL ADULT Patient Active Problem List   Diagnosis Date Noted  . Colon cancer 08/08/2014    Expected post op course  Plan: d/c foley Advance diet to full liquids Ambulate Decrease MIV   LOS: 2 days     .Rosario Adie, [facility], [place]   08/10/2014 6:57 AM

## 2014-08-11 LAB — CBC
HEMATOCRIT: 40 % (ref 39.0–52.0)
HEMOGLOBIN: 12.5 g/dL — AB (ref 13.0–17.0)
MCH: 23.5 pg — ABNORMAL LOW (ref 26.0–34.0)
MCHC: 31.3 g/dL (ref 30.0–36.0)
MCV: 75 fL — ABNORMAL LOW (ref 78.0–100.0)
Platelets: 287 10*3/uL (ref 150–400)
RBC: 5.33 MIL/uL (ref 4.22–5.81)
RDW: 19.8 % — ABNORMAL HIGH (ref 11.5–15.5)
WBC: 15.9 10*3/uL — AB (ref 4.0–10.5)

## 2014-08-11 LAB — BASIC METABOLIC PANEL
Anion gap: 10 (ref 5–15)
BUN: 13 mg/dL (ref 6–20)
CO2: 25 mmol/L (ref 22–32)
Calcium: 9.4 mg/dL (ref 8.9–10.3)
Chloride: 96 mmol/L — ABNORMAL LOW (ref 101–111)
Creatinine, Ser: 1.18 mg/dL (ref 0.61–1.24)
GFR calc Af Amer: >60 mL/min (ref >60)
GFR calc non Af Amer: >60 mL/min (ref >60)
GLUCOSE: 169 mg/dL — AB (ref 70–99)
Potassium: 4.2 mmol/L (ref 3.5–5.1)
Sodium: 131 mmol/L — ABNORMAL LOW (ref 135–145)

## 2014-08-11 NOTE — Progress Notes (Signed)
3 Days Post-Op Robotic assisted R colectomy Subjective: Pt doing well.  Min pain.  No nausea, feeling somewhat bloated  Objective: Vital signs in last 24 hours: Temp:  [98.2 F (36.8 C)-99.5 F (37.5 C)] 99.5 F (37.5 C) (05/08 0500) Pulse Rate:  [65-93] 93 (05/08 0500) Resp:  [14-16] 14 (05/08 0500) BP: (142-157)/(84-94) 142/94 mmHg (05/08 0500) SpO2:  [98 %] 98 % (05/08 0500)   Intake/Output from previous day: 05/07 0701 - 05/08 0700 In: 2350.8 [P.O.:1080; I.V.:1270.8] Out: 550 [Urine:550] Intake/Output this shift:     General appearance: alert and cooperative GI: soft, nontender  Incision: slight bloody drainage, stable  Lab Results:   Recent Labs  08/10/14 0406 08/11/14 0451  WBC 10.4 15.9*  HGB 10.5* 12.5*  HCT 33.0* 40.0  PLT 260 287   BMET  Recent Labs  08/10/14 0406 08/11/14 0451  NA 134* 131*  K 4.2 4.2  CL 104 96*  CO2 25 25  GLUCOSE 135* 169*  BUN 16 13  CREATININE 1.28* 1.18  CALCIUM 9.1 9.4   PT/INR No results for input(s): LABPROT, INR in the last 72 hours. ABG No results for input(s): PHART, HCO3 in the last 72 hours.  Invalid input(s): PCO2, PO2  MEDS, Scheduled . enoxaparin (LOVENOX) injection  40 mg Subcutaneous Q24H  . lisinopril  5 mg Oral Daily   And  . hydrochlorothiazide  6.25 mg Oral Daily    Studies/Results: No results found.  Assessment: s/p Procedure(s): XI ROBOT ASSISTED LAPAROSCOPIC RIGHT COLECTOMY HERNIA REPAIR UMBILICAL ADULT Patient Active Problem List   Diagnosis Date Noted  . Colon cancer 08/08/2014    Post op ileus  Plan: Decrease diet to NPO for now Ambulate Increase MIV Recheck labs in AM   LOS: 3 days     .Rosario Adie, [facility], [place]   08/11/2014 8:32 AM

## 2014-08-12 LAB — CBC
HEMATOCRIT: 36.3 % — AB (ref 39.0–52.0)
HEMOGLOBIN: 11.6 g/dL — AB (ref 13.0–17.0)
MCH: 24.1 pg — AB (ref 26.0–34.0)
MCHC: 32 g/dL (ref 30.0–36.0)
MCV: 75.5 fL — ABNORMAL LOW (ref 78.0–100.0)
Platelets: 263 10*3/uL (ref 150–400)
RBC: 4.81 MIL/uL (ref 4.22–5.81)
RDW: 19.6 % — ABNORMAL HIGH (ref 11.5–15.5)
WBC: 11.5 10*3/uL — ABNORMAL HIGH (ref 4.0–10.5)

## 2014-08-12 LAB — BASIC METABOLIC PANEL
Anion gap: 7 (ref 5–15)
BUN: 16 mg/dL (ref 6–20)
CALCIUM: 8.9 mg/dL (ref 8.9–10.3)
CHLORIDE: 96 mmol/L — AB (ref 101–111)
CO2: 27 mmol/L (ref 22–32)
CREATININE: 1.43 mg/dL — AB (ref 0.61–1.24)
GFR calc Af Amer: 59 mL/min — ABNORMAL LOW (ref >60)
GFR, EST NON AFRICAN AMERICAN: 51 mL/min — AB (ref >60)
GLUCOSE: 143 mg/dL — AB (ref 70–99)
Potassium: 4.3 mmol/L (ref 3.5–5.1)
SODIUM: 130 mmol/L — AB (ref 135–145)

## 2014-08-12 NOTE — Progress Notes (Signed)
4 Days Post-Op Robotic assisted R colectomy Subjective: Pt doing well.  Min pain.  No nausea today, feeling less bloated, had a small BM and some flatus yesterday  Objective: Vital signs in last 24 hours: Temp:  [98.2 F (36.8 C)-98.7 F (37.1 C)] 98.3 F (36.8 C) (05/09 0542) Pulse Rate:  [75-89] 75 (05/09 0542) Resp:  [16-18] 18 (05/09 0542) BP: (114-132)/(70-83) 114/72 mmHg (05/09 0542) SpO2:  [98 %-99 %] 99 % (05/09 0542)   Intake/Output from previous day: 05/08 0701 - 05/09 0700 In: 1886.3 [P.O.:210; I.V.:1676.3] Out: 1150 [Urine:1150] Intake/Output this shift: Total I/O In: 900 [I.V.:900] Out: 500 [Urine:500]   General appearance: alert and cooperative GI: soft, nontender  Incision: clean, dry  Lab Results:   Recent Labs  08/11/14 0451 08/12/14 0440  WBC 15.9* 11.5*  HGB 12.5* 11.6*  HCT 40.0 36.3*  PLT 287 263   BMET  Recent Labs  08/11/14 0451 08/12/14 0440  NA 131* 130*  K 4.2 4.3  CL 96* 96*  CO2 25 27  GLUCOSE 169* 143*  BUN 13 16  CREATININE 1.18 1.43*  CALCIUM 9.4 8.9   PT/INR No results for input(s): LABPROT, INR in the last 72 hours. ABG No results for input(s): PHART, HCO3 in the last 72 hours.  Invalid input(s): PCO2, PO2  MEDS, Scheduled . enoxaparin (LOVENOX) injection  40 mg Subcutaneous Q24H  . lisinopril  5 mg Oral Daily   And  . hydrochlorothiazide  6.25 mg Oral Daily    Studies/Results: No results found.  Assessment: s/p Procedure(s): XI ROBOT ASSISTED LAPAROSCOPIC RIGHT COLECTOMY HERNIA REPAIR UMBILICAL ADULT Patient Active Problem List   Diagnosis Date Noted  . Colon cancer 08/08/2014    Post op ileus  Plan: Will try clears today Ambulate Cont MIV Recheck labs in AM   LOS: 4 days     .Rosario Adie, [facility], [place]   08/12/2014 6:49 AM

## 2014-08-13 LAB — CBC
HEMATOCRIT: 35.5 % — AB (ref 39.0–52.0)
Hemoglobin: 11.1 g/dL — ABNORMAL LOW (ref 13.0–17.0)
MCH: 23.5 pg — AB (ref 26.0–34.0)
MCHC: 31.3 g/dL (ref 30.0–36.0)
MCV: 75.1 fL — ABNORMAL LOW (ref 78.0–100.0)
Platelets: 258 10*3/uL (ref 150–400)
RBC: 4.73 MIL/uL (ref 4.22–5.81)
RDW: 19.4 % — ABNORMAL HIGH (ref 11.5–15.5)
WBC: 8.2 10*3/uL (ref 4.0–10.5)

## 2014-08-13 LAB — BASIC METABOLIC PANEL
ANION GAP: 10 (ref 5–15)
BUN: 16 mg/dL (ref 6–20)
CO2: 25 mmol/L (ref 22–32)
Calcium: 8.9 mg/dL (ref 8.9–10.3)
Chloride: 94 mmol/L — ABNORMAL LOW (ref 101–111)
Creatinine, Ser: 1.29 mg/dL — ABNORMAL HIGH (ref 0.61–1.24)
GFR calc Af Amer: >60 mL/min (ref >60)
GFR, EST NON AFRICAN AMERICAN: 58 mL/min — AB (ref >60)
Glucose, Bld: 134 mg/dL — ABNORMAL HIGH (ref 70–99)
Potassium: 4.2 mmol/L (ref 3.5–5.1)
SODIUM: 129 mmol/L — AB (ref 135–145)

## 2014-08-13 NOTE — Plan of Care (Signed)
Problem: Phase II Progression Outcomes Goal: Tolerating diet Outcome: Progressing Pt has tolerated clear liquid diet and will now progress to soft diet today.

## 2014-08-13 NOTE — Progress Notes (Signed)
5 Days Post-Op Robotic assisted R colectomy Subjective: Pt doing well.  Min pain.  No nausea today, feeling less bloated,having loose BM's  Objective: Vital signs in last 24 hours: Temp:  [98 F (36.7 C)-98.3 F (36.8 C)] 98.3 F (36.8 C) (05/10 0443) Pulse Rate:  [74-84] 74 (05/10 0443) Resp:  [16-18] 16 (05/10 0443) BP: (100-129)/(62-70) 106/70 mmHg (05/10 0443) SpO2:  [97 %-100 %] 97 % (05/10 0443)   Intake/Output from previous day: 05/09 0701 - 05/10 0700 In: 2010 [P.O.:210; I.V.:1800] Out: 800 [Urine:800] Intake/Output this shift:   General appearance: alert and cooperative GI: soft, nontender  Incision: clean, dry  Lab Results:   Recent Labs  08/12/14 0440 08/13/14 0447  WBC 11.5* 8.2  HGB 11.6* 11.1*  HCT 36.3* 35.5*  PLT 263 258   BMET  Recent Labs  08/12/14 0440 08/13/14 0447  NA 130* 129*  K 4.3 4.2  CL 96* 94*  CO2 27 25  GLUCOSE 143* 134*  BUN 16 16  CREATININE 1.43* 1.29*  CALCIUM 8.9 8.9   PT/INR No results for input(s): LABPROT, INR in the last 72 hours. ABG No results for input(s): PHART, HCO3 in the last 72 hours.  Invalid input(s): PCO2, PO2  MEDS, Scheduled . enoxaparin (LOVENOX) injection  40 mg Subcutaneous Q24H  . lisinopril  5 mg Oral Daily   And  . hydrochlorothiazide  6.25 mg Oral Daily    Studies/Results: No results found.  Assessment: s/p Procedure(s): XI ROBOT ASSISTED LAPAROSCOPIC RIGHT COLECTOMY HERNIA REPAIR UMBILICAL ADULT Patient Active Problem List   Diagnosis Date Noted  . Colon cancer 08/08/2014    Post op ileus resolving  Plan: Will advance diet Ambulate SL MIV    LOS: 5 days     .Rosario Adie, [facility], [place]   08/13/2014 8:09 AM

## 2014-08-14 MED ORDER — METOCLOPRAMIDE HCL 5 MG PO TABS
5.0000 mg | ORAL_TABLET | Freq: Three times a day (TID) | ORAL | Status: DC
  Administered 2014-08-14 – 2014-08-15 (×4): 5 mg via ORAL
  Filled 2014-08-14 (×8): qty 1

## 2014-08-14 MED ORDER — TRAMADOL HCL 50 MG PO TABS: 50.0000 mg | ORAL_TABLET | Freq: Four times a day (QID) | ORAL | Status: DC | PRN

## 2014-08-14 NOTE — Progress Notes (Signed)
6 Days Post-Op Robotic assisted R colectomy Subjective: Pt had another episode of vomiting yesterday.  Emesis is mostly undigested food and non-bilious.  Min pain.  No nausea today, passing flatus and having loose BM's  Objective: Vital signs in last 24 hours: Temp:  [98.1 F (36.7 C)-98.8 F (37.1 C)] 98.8 F (37.1 C) (05/11 0559) Pulse Rate:  [67-73] 72 (05/11 0559) Resp:  [16-18] 18 (05/11 0559) BP: (114-125)/(62-73) 115/73 mmHg (05/11 0559) SpO2:  [98 %-99 %] 98 % (05/11 0559)   Intake/Output from previous day: 05/10 0701 - 05/11 0700 In: 750 [P.O.:600; I.V.:150] Out: 1800 [Urine:1800] Intake/Output this shift:   General appearance: alert and cooperative GI: soft, nontender  Incision: clean, dry  Lab Results:   Recent Labs  08/12/14 0440 08/13/14 0447  WBC 11.5* 8.2  HGB 11.6* 11.1*  HCT 36.3* 35.5*  PLT 263 258   BMET  Recent Labs  08/12/14 0440 08/13/14 0447  NA 130* 129*  K 4.3 4.2  CL 96* 94*  CO2 27 25  GLUCOSE 143* 134*  BUN 16 16  CREATININE 1.43* 1.29*  CALCIUM 8.9 8.9   PT/INR No results for input(s): LABPROT, INR in the last 72 hours. ABG No results for input(s): PHART, HCO3 in the last 72 hours.  Invalid input(s): PCO2, PO2  MEDS, Scheduled . enoxaparin (LOVENOX) injection  40 mg Subcutaneous Q24H  . lisinopril  5 mg Oral Daily   And  . hydrochlorothiazide  6.25 mg Oral Daily  . metoCLOPramide  5 mg Oral TID AC    Studies/Results: No results found.  Assessment: s/p Procedure(s): XI ROBOT ASSISTED LAPAROSCOPIC RIGHT COLECTOMY HERNIA REPAIR UMBILICAL ADULT Patient Active Problem List   Diagnosis Date Noted  . Colon cancer 08/08/2014    Post op ileus resolving  Plan: Will try reglan prior to meals today to see if that will help some Will change narcotics to see if that helps his nausea.   Cont to ambulate Cont SL MIV    LOS: 6 days     .Rosario Adie, [facility],  [place] [phone]   08/14/2014 8:48 AM

## 2014-08-14 NOTE — Plan of Care (Signed)
Problem: Discharge Progression Outcomes Goal: Tolerating diet Outcome: Progressing Pt continues on soft diet. Tolerated lunch well previous day. Encouraging small portions and bland foods to assist with nausea

## 2014-08-14 NOTE — Progress Notes (Signed)
Patient had an episode of emesis after lunch. He expressed relief of GI symptoms afterwards and declined any antiemetic. Patient encouraged to eat smaller portions at meal times and take breaks while eating. Patient started on Reglan before meals and expressed relief of heavy abdominal feeling after taking medications. Mark Owen

## 2014-08-15 ENCOUNTER — Inpatient Hospital Stay (HOSPITAL_COMMUNITY): Payer: 59

## 2014-08-15 LAB — BASIC METABOLIC PANEL
Anion gap: 12 (ref 5–15)
BUN: 24 mg/dL — AB (ref 6–20)
CALCIUM: 9.4 mg/dL (ref 8.9–10.3)
CO2: 26 mmol/L (ref 22–32)
CREATININE: 1.4 mg/dL — AB (ref 0.61–1.24)
Chloride: 90 mmol/L — ABNORMAL LOW (ref 101–111)
GFR calc Af Amer: >60 mL/min (ref >60)
GFR calc non Af Amer: 52 mL/min — ABNORMAL LOW (ref >60)
Glucose, Bld: 125 mg/dL — ABNORMAL HIGH (ref 65–99)
Potassium: 4.3 mmol/L (ref 3.5–5.1)
Sodium: 128 mmol/L — ABNORMAL LOW (ref 135–145)

## 2014-08-15 LAB — CBC
HCT: 37.9 % — ABNORMAL LOW (ref 39.0–52.0)
Hemoglobin: 11.9 g/dL — ABNORMAL LOW (ref 13.0–17.0)
MCH: 23.4 pg — AB (ref 26.0–34.0)
MCHC: 31.4 g/dL (ref 30.0–36.0)
MCV: 74.5 fL — ABNORMAL LOW (ref 78.0–100.0)
Platelets: 276 10*3/uL (ref 150–400)
RBC: 5.09 MIL/uL (ref 4.22–5.81)
RDW: 19.1 % — AB (ref 11.5–15.5)
WBC: 10 10*3/uL (ref 4.0–10.5)

## 2014-08-15 MED ORDER — PHENOL 1.4 % MT LIQD
1.0000 | OROMUCOSAL | Status: DC | PRN
  Filled 2014-08-15: qty 177

## 2014-08-15 MED ORDER — SODIUM CHLORIDE 0.9 % IV SOLN
INTRAVENOUS | Status: DC
  Administered 2014-08-15 – 2014-08-16 (×3): via INTRAVENOUS
  Administered 2014-08-16: 1000 mL via INTRAVENOUS
  Administered 2014-08-17: 07:00:00 via INTRAVENOUS
  Administered 2014-08-17: 100 mL/h via INTRAVENOUS
  Administered 2014-08-18 (×2): via INTRAVENOUS

## 2014-08-15 MED ORDER — IOHEXOL 300 MG/ML  SOLN
80.0000 mL | Freq: Once | INTRAMUSCULAR | Status: AC | PRN
  Administered 2014-08-15: 100 mL via INTRAVENOUS

## 2014-08-15 MED ORDER — SODIUM CHLORIDE 0.9 % IV BOLUS (SEPSIS)
1000.0000 mL | Freq: Once | INTRAVENOUS | Status: AC
  Administered 2014-08-15: 1000 mL via INTRAVENOUS

## 2014-08-15 MED ORDER — ENSURE PUDDING PO PUDG
1.0000 | Freq: Three times a day (TID) | ORAL | Status: DC
  Administered 2014-08-18: 1 via ORAL
  Filled 2014-08-15 (×18): qty 1

## 2014-08-15 MED ORDER — PANTOPRAZOLE SODIUM 40 MG PO TBEC
40.0000 mg | DELAYED_RELEASE_TABLET | Freq: Two times a day (BID) | ORAL | Status: DC
  Administered 2014-08-15 – 2014-08-20 (×10): 40 mg via ORAL
  Filled 2014-08-15 (×12): qty 1

## 2014-08-15 NOTE — Progress Notes (Signed)
Bolus of 1000cc NS given per dr. Marcello Moores' order.  Pt voiding amber urine but unable to pass gas presently.  Slowly sipping on CT contrast for upcoming scan.  C/o abd fullness and some nausea

## 2014-08-15 NOTE — Progress Notes (Signed)
7 Days Post-Op Robotic assisted R colectomy Subjective: Pt had another episode of vomiting yesterday after lunch (nonbilious).  Tolerated dinner ok.  Min pain.  No nausea today, passing flatus and having small loose BM's  Objective: Vital signs in last 24 hours: Temp:  [98.2 F (36.8 C)-98.5 F (36.9 C)] 98.2 F (36.8 C) (05/12 0532) Pulse Rate:  [73-76] 75 (05/12 0532) Resp:  [16-18] 18 (05/12 0532) BP: (112-129)/(70-85) 115/85 mmHg (05/12 0532) SpO2:  [96 %-99 %] 99 % (05/12 0532)   Intake/Output from previous day: 05/11 0701 - 05/12 0700 In: 360 [P.O.:360] Out: 550 [Urine:550] Intake/Output this shift:   General appearance: alert and cooperative GI: soft, nontender, non-distended  Incision: clean, dry  Lab Results:   Recent Labs  08/13/14 0447  WBC 8.2  HGB 11.1*  HCT 35.5*  PLT 258   BMET  Recent Labs  08/13/14 0447  NA 129*  K 4.2  CL 94*  CO2 25  GLUCOSE 134*  BUN 16  CREATININE 1.29*  CALCIUM 8.9   PT/INR No results for input(s): LABPROT, INR in the last 72 hours. ABG No results for input(s): PHART, HCO3 in the last 72 hours.  Invalid input(s): PCO2, PO2  MEDS, Scheduled . enoxaparin (LOVENOX) injection  40 mg Subcutaneous Q24H  . feeding supplement (ENSURE)  1 Container Oral TID BM  . lisinopril  5 mg Oral Daily   And  . hydrochlorothiazide  6.25 mg Oral Daily  . metoCLOPramide  5 mg Oral TID AC  . pantoprazole  40 mg Oral BID    Studies/Results: No results found.  Assessment: s/p Procedure(s): XI ROBOT ASSISTED LAPAROSCOPIC RIGHT COLECTOMY HERNIA REPAIR UMBILICAL ADULT Patient Active Problem List   Diagnosis Date Noted  . Colon cancer 08/08/2014    Post op ileus seems to be resolved by physical exam but pt still vomiting.    Plan: Will cont reglan prior to meals today and add protonix in case this is due to gastritises  Will get Abd film to eval further.  May need CT later today Cont to ambulate Cont SL MIV    LOS: 7  days     .Rosario Adie, [facility], [place] [phone]   08/15/2014 8:30 AM

## 2014-08-16 NOTE — Plan of Care (Signed)
Problem: Phase III Progression Outcomes Goal: Other Phase III Outcomes/Goals NG tube education

## 2014-08-16 NOTE — Progress Notes (Signed)
8 Days Post-Op Robotic assisted R colectomy Subjective: CT yesterday showed ileus with no signs of leak or infection.  Anastomosis appears patent.  NG inserted with good output.  Pt had 2 BM's and is passing flatus.  Objective: Vital signs in last 24 hours: Temp:  [98.1 F (36.7 C)-98.9 F (37.2 C)] 98.9 F (37.2 C) (05/13 0515) Pulse Rate:  [72-79] 79 (05/13 0515) Resp:  [16] 16 (05/13 0515) BP: (113-139)/(71-81) 139/81 mmHg (05/13 0515) SpO2:  [97 %-100 %] 98 % (05/13 0515)   Intake/Output from previous day: 05/12 0701 - 05/13 0700 In: 540 [P.O.:540] Out: 2527 [Urine:875; Emesis/NG output:1650; Stool:2] Intake/Output this shift:   General appearance: alert and cooperative GI: soft, nontender, non-distended NG output clear Incision: clean, dry  Lab Results:   Recent Labs  08/15/14 0858  WBC 10.0  HGB 11.9*  HCT 37.9*  PLT 276   BMET  Recent Labs  08/15/14 0858  NA 128*  K 4.3  CL 90*  CO2 26  GLUCOSE 125*  BUN 24*  CREATININE 1.40*  CALCIUM 9.4   PT/INR No results for input(s): LABPROT, INR in the last 72 hours. ABG No results for input(s): PHART, HCO3 in the last 72 hours.  Invalid input(s): PCO2, PO2  MEDS, Scheduled . enoxaparin (LOVENOX) injection  40 mg Subcutaneous Q24H  . feeding supplement (ENSURE)  1 Container Oral TID BM  . lisinopril  5 mg Oral Daily   And  . hydrochlorothiazide  6.25 mg Oral Daily  . pantoprazole  40 mg Oral BID    Studies/Results: Ct Abdomen Pelvis W Contrast  08/15/2014   CLINICAL DATA:  New right colon cancer status post partial colectomy.  EXAM: CT ABDOMEN AND PELVIS WITH CONTRAST  TECHNIQUE: Multidetector CT imaging of the abdomen and pelvis was performed using the standard protocol following bolus administration of intravenous contrast.  CONTRAST:  175mL OMNIPAQUE IOHEXOL 300 MG/ML  SOLN  COMPARISON:  Radiograph 08/15/2014, CT 07/22/2014  FINDINGS: Lower chest: Lung bases are clear.  Hepatobiliary: No focal  hepatic lesion. No biliary duct dilatation. Gallbladder is normal. Common bile duct is normal.  Pancreas: Pancreas is normal. No ductal dilatation. No pancreatic inflammation.  Spleen: Normal spleen  Adrenals/urinary tract: Adrenal glands and kidneys are normal. The ureters and bladder normal.  Stomach/Bowel: The stomach and proximal small bowel are distended with the oral contrast. The proximal small bowel measures up to 4 cm. The mid to distal distal small bowel is fluid-filled and smaller caliber. Caliber continues to decrease up to the enteric colonic anastomosis in the right lower quadrant. While there is an decrease in caliber throughout the small bowel there is no focal transition point. No pneumatosis or intraperitoneal free air. No portal venous gas. Small volume stool in the right colon. The left colon is probably collapse.  Vascular/Lymphatic: Abdominal aorta is normal caliber. There is no retroperitoneal or periportal lymphadenopathy. No pelvic lymphadenopathy.  Reproductive: Prostate gland is normal. Tiny amount of gas in the left inguinal canal likely relates to recent surgery.  Musculoskeletal: No aggressive osseous lesion.  Other: No free fluid.  IMPRESSION: Poor progression of oral contrast through the small bowel with dilated proximal small bowel and decreased caliber distal small bowel up to the anastomosis. Favor postsurgical ileus over mechanical obstruction. Recommend follow-up serial radiographs to followup progression of the oral contrast through the bowel.   Electronically Signed   By: Suzy Bouchard M.D.   On: 08/15/2014 16:48   Dg Abd Portable 1v  08/15/2014  CLINICAL DATA:  Diffuse abdominal pain and distention. Nausea and vomiting. Postop ileus. Postop from partial colectomy for colon carcinoma.  EXAM: PORTABLE ABDOMEN - 1 VIEW  COMPARISON:  None.  FINDINGS: Multiple moderately dilated small bowel loops are seen in the abdomen and pelvis measuring up to 6 mm in diameter. There is a  relative paucity of colonic gas. This may be due to a small bowel ileus, however and distal small bowel obstruction cannot be excluded.  IMPRESSION: Small bowel ileus versus distal small bowel obstruction. Continued radiographic follow-up is recommended.   Electronically Signed   By: Earle Gell M.D.   On: 08/15/2014 09:49    Assessment: s/p Procedure(s): XI ROBOT ASSISTED LAPAROSCOPIC RIGHT COLECTOMY HERNIA REPAIR UMBILICAL ADULT Patient Active Problem List   Diagnosis Date Noted  . Colon cancer 08/08/2014    Post op ileus    Plan: Will clamp NG and recheck later today Cont to ambulate Cont MIV Recheck labs in AM    LOS: 8 days     .Rosario Adie, [facility], [place]   08/16/2014 7:13 AM

## 2014-08-17 LAB — BASIC METABOLIC PANEL
Anion gap: 11 (ref 5–15)
BUN: 22 mg/dL — AB (ref 6–20)
CHLORIDE: 98 mmol/L — AB (ref 101–111)
CO2: 23 mmol/L (ref 22–32)
Calcium: 8.8 mg/dL — ABNORMAL LOW (ref 8.9–10.3)
Creatinine, Ser: 1.3 mg/dL — ABNORMAL HIGH (ref 0.61–1.24)
GFR calc Af Amer: >60 mL/min (ref >60)
GFR calc non Af Amer: 57 mL/min — ABNORMAL LOW (ref >60)
GLUCOSE: 83 mg/dL (ref 65–99)
Potassium: 3.9 mmol/L (ref 3.5–5.1)
Sodium: 132 mmol/L — ABNORMAL LOW (ref 135–145)

## 2014-08-17 NOTE — Progress Notes (Signed)
9 Days Post-Op  Subjective: Passing a lot of gas.  No nausea or vomiting.  Hears stomach rumbling.  Objective: Vital signs in last 24 hours: Temp:  [98.2 F (36.8 C)-98.4 F (36.9 C)] 98.4 F (36.9 C) (05/14 0550) Pulse Rate:  [67-79] 72 (05/14 0550) Resp:  [18] 18 (05/14 0550) BP: (116-127)/(63-72) 125/72 mmHg (05/14 1000) SpO2:  [100 %] 100 % (05/14 0550) Last BM Date: 08/17/14  Intake/Output from previous day: 05/13 0701 - 05/14 0700 In: 2400 [I.V.:2400] Out: 750 [Urine:550; Emesis/NG output:200] Intake/Output this shift: Total I/O In: 400 [I.V.:400] Out: 250 [Urine:250]  PE: General- In NAD Abdomen-soft, incisions clean and intact, hypoactive bowel sounds, some distension  Lab Results:   Recent Labs  08/15/14 0858  WBC 10.0  HGB 11.9*  HCT 37.9*  PLT 276   BMET  Recent Labs  08/15/14 0858 08/17/14 0601  NA 128* 132*  K 4.3 3.9  CL 90* 98*  CO2 26 23  GLUCOSE 125* 83  BUN 24* 22*  CREATININE 1.40* 1.30*  CALCIUM 9.4 8.8*   PT/INR No results for input(s): LABPROT, INR in the last 72 hours. Comprehensive Metabolic Panel:    Component Value Date/Time   NA 132* 08/17/2014 0601   NA 128* 08/15/2014 0858   K 3.9 08/17/2014 0601   K 4.3 08/15/2014 0858   CL 98* 08/17/2014 0601   CL 90* 08/15/2014 0858   CO2 23 08/17/2014 0601   CO2 26 08/15/2014 0858   BUN 22* 08/17/2014 0601   BUN 24* 08/15/2014 0858   CREATININE 1.30* 08/17/2014 0601   CREATININE 1.40* 08/15/2014 0858   GLUCOSE 83 08/17/2014 0601   GLUCOSE 125* 08/15/2014 0858   CALCIUM 8.8* 08/17/2014 0601   CALCIUM 9.4 08/15/2014 0858     Studies/Results: Ct Abdomen Pelvis W Contrast  08/15/2014   CLINICAL DATA:  New right colon cancer status post partial colectomy.  EXAM: CT ABDOMEN AND PELVIS WITH CONTRAST  TECHNIQUE: Multidetector CT imaging of the abdomen and pelvis was performed using the standard protocol following bolus administration of intravenous contrast.  CONTRAST:  152mL  OMNIPAQUE IOHEXOL 300 MG/ML  SOLN  COMPARISON:  Radiograph 08/15/2014, CT 07/22/2014  FINDINGS: Lower chest: Lung bases are clear.  Hepatobiliary: No focal hepatic lesion. No biliary duct dilatation. Gallbladder is normal. Common bile duct is normal.  Pancreas: Pancreas is normal. No ductal dilatation. No pancreatic inflammation.  Spleen: Normal spleen  Adrenals/urinary tract: Adrenal glands and kidneys are normal. The ureters and bladder normal.  Stomach/Bowel: The stomach and proximal small bowel are distended with the oral contrast. The proximal small bowel measures up to 4 cm. The mid to distal distal small bowel is fluid-filled and smaller caliber. Caliber continues to decrease up to the enteric colonic anastomosis in the right lower quadrant. While there is an decrease in caliber throughout the small bowel there is no focal transition point. No pneumatosis or intraperitoneal free air. No portal venous gas. Small volume stool in the right colon. The left colon is probably collapse.  Vascular/Lymphatic: Abdominal aorta is normal caliber. There is no retroperitoneal or periportal lymphadenopathy. No pelvic lymphadenopathy.  Reproductive: Prostate gland is normal. Tiny amount of gas in the left inguinal canal likely relates to recent surgery.  Musculoskeletal: No aggressive osseous lesion.  Other: No free fluid.  IMPRESSION: Poor progression of oral contrast through the small bowel with dilated proximal small bowel and decreased caliber distal small bowel up to the anastomosis. Favor postsurgical ileus over mechanical obstruction. Recommend  follow-up serial radiographs to followup progression of the oral contrast through the bowel.   Electronically Signed   By: Suzy Bouchard M.D.   On: 08/15/2014 16:48    Anti-infectives: Anti-infectives    Start     Dose/Rate Route Frequency Ordered Stop   08/08/14 2200  clindamycin (CLEOCIN) IVPB 900 mg     900 mg 100 mL/hr over 30 Minutes Intravenous 3 times per day  08/08/14 1845 08/08/14 2148   08/08/14 0000  gentamicin (GARAMYCIN) 420 mg in dextrose 5 % 100 mL IVPB     5 mg/kg  84.6 kg 110.5 mL/hr over 60 Minutes Intravenous 60 min pre-op 08/07/14 1912 08/08/14 1347   08/07/14 1912  clindamycin (CLEOCIN) IVPB 900 mg     900 mg 100 mL/hr over 30 Minutes Intravenous 60 min pre-op 08/07/14 1912 08/08/14 1330      Assessment Active Problems:   Right colon cancer (T3N0) s/p robotic partial colectomy 08/08/14-post op ileus starting to improve clinically.    LOS: 9 days   Plan: Try clear liquids today.   Mark Owen 08/17/2014

## 2014-08-18 NOTE — Progress Notes (Signed)
10 Days Post-Op  Subjective: Feeling much better today Tolerated clears Has had 2 BM's today  Objective: Vital signs in last 24 hours: Temp:  [97.8 F (36.6 C)-99 F (37.2 C)] 97.8 F (36.6 C) (05/15 1021) Pulse Rate:  [62-74] 72 (05/15 1021) Resp:  [18-20] 18 (05/15 1021) BP: (118-128)/(66-82) 122/82 mmHg (05/15 1021) SpO2:  [100 %] 100 % (05/15 1021) Last BM Date: 08/18/14  Intake/Output from previous day: 05/14 0701 - 05/15 0700 In: 3360 [P.O.:960; I.V.:2400] Out: 1450 [Urine:1450] Intake/Output this shift: Total I/O In: 120 [P.O.:120] Out: 400 [Urine:400]  Abdomen soft, minimally full, non tender  Lab Results:  No results for input(s): WBC, HGB, HCT, PLT in the last 72 hours. BMET  Recent Labs  08/17/14 0601  NA 132*  K 3.9  CL 98*  CO2 23  GLUCOSE 83  BUN 22*  CREATININE 1.30*  CALCIUM 8.8*   PT/INR No results for input(s): LABPROT, INR in the last 72 hours. ABG No results for input(s): PHART, HCO3 in the last 72 hours.  Invalid input(s): PCO2, PO2  Studies/Results: No results found.  Anti-infectives: Anti-infectives    Start     Dose/Rate Route Frequency Ordered Stop   08/08/14 2200  clindamycin (CLEOCIN) IVPB 900 mg     900 mg 100 mL/hr over 30 Minutes Intravenous 3 times per day 08/08/14 1845 08/08/14 2148   08/08/14 0000  gentamicin (GARAMYCIN) 420 mg in dextrose 5 % 100 mL IVPB     5 mg/kg  84.6 kg 110.5 mL/hr over 60 Minutes Intravenous 60 min pre-op 08/07/14 1912 08/08/14 1347   08/07/14 1912  clindamycin (CLEOCIN) IVPB 900 mg     900 mg 100 mL/hr over 30 Minutes Intravenous 60 min pre-op 08/07/14 1912 08/08/14 1330      Assessment/Plan: s/p Procedure(s): XI ROBOT ASSISTED LAPAROSCOPIC RIGHT COLECTOMY (N/A) HERNIA REPAIR UMBILICAL ADULT  Resolving ileus  Wants to try full liquids today  LOS: 10 days    Myrl Bynum A 08/18/2014

## 2014-08-19 NOTE — Progress Notes (Signed)
11 Days Post-Op  Subjective: Feeling much better today Tolerated fulls Having BM's and passing flatus, no nausea  Objective: Vital signs in last 24 hours: Temp:  [97.8 F (36.6 C)-98.7 F (37.1 C)] 98 F (36.7 C) (05/16 0551) Pulse Rate:  [58-72] 58 (05/16 0551) Resp:  [18-20] 18 (05/16 0551) BP: (122-123)/(64-82) 123/67 mmHg (05/16 0551) SpO2:  [100 %] 100 % (05/16 0551) Last BM Date: 08/18/14  Intake/Output from previous day: 05/15 0701 - 05/16 0700 In: 2831.7 [P.O.:840; I.V.:1991.7] Out: 1850 [Urine:1850] Intake/Output this shift:    Abdomen soft, minimally full, non tender  Lab Results:  No results for input(s): WBC, HGB, HCT, PLT in the last 72 hours. BMET  Recent Labs  08/17/14 0601  NA 132*  K 3.9  CL 98*  CO2 23  GLUCOSE 83  BUN 22*  CREATININE 1.30*  CALCIUM 8.8*   PT/INR No results for input(s): LABPROT, INR in the last 72 hours. ABG No results for input(s): PHART, HCO3 in the last 72 hours.  Invalid input(s): PCO2, PO2  Studies/Results: No results found.  Anti-infectives: Anti-infectives    Start     Dose/Rate Route Frequency Ordered Stop   08/08/14 2200  clindamycin (CLEOCIN) IVPB 900 mg     900 mg 100 mL/hr over 30 Minutes Intravenous 3 times per day 08/08/14 1845 08/08/14 2148   08/08/14 0000  gentamicin (GARAMYCIN) 420 mg in dextrose 5 % 100 mL IVPB     5 mg/kg  84.6 kg 110.5 mL/hr over 60 Minutes Intravenous 60 min pre-op 08/07/14 1912 08/08/14 1347   08/07/14 1912  clindamycin (CLEOCIN) IVPB 900 mg     900 mg 100 mL/hr over 30 Minutes Intravenous 60 min pre-op 08/07/14 1912 08/08/14 1330      Assessment/Plan: s/p Procedure(s): XI ROBOT ASSISTED LAPAROSCOPIC RIGHT COLECTOMY (N/A) HERNIA REPAIR UMBILICAL ADULT  Resolving ileus  Cont fulls SLIV D/C tom if still doing well   LOS: 11 days    Jayshawn Colston C. 6/57/8469

## 2014-08-19 NOTE — Plan of Care (Signed)
Problem: Altered GI Function ([place]-1.4) Goal: OTHER DIET/NUTRITION GOAL(S) Patient able to tolerate diet without nausea and vomitting  Outcome: Progressing This morning patient tolerating full liquid diet, no nausea or vomiting this am.

## 2014-08-20 ENCOUNTER — Encounter (HOSPITAL_COMMUNITY): Payer: Self-pay

## 2014-08-20 NOTE — Discharge Instructions (Signed)

## 2014-08-20 NOTE — Discharge Planning (Signed)
Discharge instructions reviewed with pt and wife. All questions answered regarding surgery care and diet. Pt and wife in understanding of these instructions. Pt wheeled out to car by Nurse Tech in NAD.

## 2014-08-20 NOTE — Discharge Summary (Signed)
Physician Discharge Summary  Patient ID: ANIRUDH BAIZ MRN: [id number] DOB/AGE: Jul 26, 1950 64 y.o.  Admit date: 08/08/2014 Discharge date: 08/20/2014  Admission Diagnoses: Colon cancer  Discharge Diagnoses:  Active Problems:   Colon cancer   Discharged Condition: good  Hospital Course: Patient admitted after right robotic colectomy.  His diet was advanced as tolerated.  He developed an ileus.  A CT was performed on POD 7 and was negative.  An NG was placed.  This helped to resolve his ileus.  His diet was advanced again.  He was discharged in stable condition.    Consults: None  Significant Diagnostic Studies: labs: cbc, chemistry  Treatments: IV hydration, analgesia: acetaminophen w/ codeine and surgery: see above  Discharge Exam: Blood pressure 109/56, pulse 56, temperature 98 F (36.7 C), temperature source Oral, resp. rate 18, height 6' (1.829 m), weight 84.482 kg (186 lb 4 oz), SpO2 100 %. General appearance: alert and cooperative GI: normal findings: soft, non-tender Incision/Wound: clean, dry, intact  Disposition: 01-Home or Self Care     Medication List    STOP taking these medications        metroNIDAZOLE 500 MG tablet  Commonly known as:  FLAGYL     MIRALAX PO     neomycin 500 MG tablet  Commonly known as:  MYCIFRADIN      TAKE these medications        bisacodyl 5 MG EC tablet  Commonly known as:  DULCOLAX  Take 20 mg by mouth once. Day of procedure at 7 am     ibuprofen 200 MG tablet  Commonly known as:  ADVIL,MOTRIN  Take 400 mg by mouth every 6 (six) hours as needed for headache or moderate pain.     IRON PO  Take 1 tablet by mouth daily.     lisinopril-hydrochlorothiazide 10-12.5 MG per tablet  Commonly known as:  PRINZIDE,ZESTORETIC  Take 0.5 tablets by mouth every morning.     multivitamin with minerals Tabs tablet  Take 1 tablet by mouth every morning.     SAW PALMETTO PO  Take 1 tablet by mouth daily.     VISINE OP  Apply  1-52 drops to eye daily as needed (dry eyes.).     VITAMIN D3 PO  Take 1 tablet by mouth daily.           Follow-up Information    Follow up with Rosario Adie., MD. Schedule an appointment as soon as possible for a visit in 2 weeks.   Specialty:  General Surgery   Contact information:   [address] [place] [address] [phone]       Signed: Rosario Adie 8/78/6767, 2:09 AM

## 2014-08-21 ENCOUNTER — Telehealth: Payer: Self-pay | Admitting: *Deleted

## 2014-08-21 NOTE — Telephone Encounter (Signed)
After discussion with Dr. Marcello Moores today, she requests he be seen in [facility] on 08/23/14. Called patient and explained clinic and arrival time at 0900. Will see Dr. Marcello Moores at [phone] and Dr. Benay Spice at 1030. Instructed him to disregard any other messages he may get from automated line about the appointments. He understands and agrees. Notified Mammie Lorenzo with [facility] to put patient on Dr. Manon Hilding schedule. Patient is already aware of appointment time and location.

## 2014-08-22 ENCOUNTER — Encounter (HOSPITAL_COMMUNITY): Payer: Self-pay

## 2014-08-23 ENCOUNTER — Ambulatory Visit: Payer: 59 | Admitting: Nutrition

## 2014-08-23 ENCOUNTER — Encounter: Payer: Self-pay | Admitting: Physical Therapy

## 2014-08-23 ENCOUNTER — Encounter: Payer: Self-pay | Admitting: Oncology

## 2014-08-23 ENCOUNTER — Ambulatory Visit (HOSPITAL_BASED_OUTPATIENT_CLINIC_OR_DEPARTMENT_OTHER): Payer: 59 | Admitting: Oncology

## 2014-08-23 ENCOUNTER — Ambulatory Visit: Payer: 59 | Attending: General Surgery | Admitting: Physical Therapy

## 2014-08-23 VITALS — BP 135/75 | HR 69 | Temp 97.8°F | Resp 18 | Ht 72.0 in | Wt 175.5 lb

## 2014-08-23 DIAGNOSIS — C182 Malignant neoplasm of ascending colon: Secondary | ICD-10-CM | POA: Diagnosis not present

## 2014-08-23 DIAGNOSIS — C189 Malignant neoplasm of colon, unspecified: Secondary | ICD-10-CM

## 2014-08-23 DIAGNOSIS — R911 Solitary pulmonary nodule: Secondary | ICD-10-CM | POA: Diagnosis not present

## 2014-08-23 NOTE — Patient Instructions (Signed)
Patient was instructed today in a home exercise program, posture, and was educated on the importance of a walking program and energy conservation.  Tips for Energy Conservation for Activities of Daily Living . Plan ahead to avoid rushing. . Sit down to bathe and dry off. Wear a terry robe instead of drying off. . Use a shower/bath organizer to decrease leaning and reaching. . Use extension handles on sponges and brushes. Susa Simmonds grab rails in the bathroom or use an elevated toilet seat. Hoyle Barr out clothes and toiletries before dressing. . Minimize leaning over to put on clothes and shoes. Bring your foot to your knee to apply socks and shoes. . Wear comfortable shoes and low-heeled, slip on shoes. Wear button front shirts rather than pullovers. Housekeeping . Schedule household tasks throughout the week. . Do housework sitting down when possible. . Delegate heavy housework, shopping, laundry and child care when possible. . Drag or slide objects rather than lifting. . Sit when ironing and take rest periods. . Stop working before becoming overly tired. Shopping . Organize list by aisle. . Use a grocery cart for support. Marland Kitchen Shop at less busy times. . Ask for help with getting to the car. Meal Preparation . Use convenience and easy-to-prepare foods. . Use small appliances that take less effort to use. Marland Kitchen Prepare meals sitting down. . Soak dishes instead of scrubbing and let dishes air dry. . Prepare double portions and freeze half. Child Care . Plan activities that can be done sitting down, such as drawing pictures, playing games, reading, and computer games. . Encourage children to climb up onto your lap or into the highchair instead of being lifted. . Make a game of the household chores so that children will want to help. . Delegate child care when possible.   PT educated patient on getting in and out of tub using a railing on wall, no mats that are slippery, and stopping after 30  min of driving. Patient and wife verbally understand.

## 2014-08-23 NOTE — [facility] Oncology Navigator Note (Signed)
Oncology Nurse Navigator Documentation  Oncology Nurse Navigator Flowsheets 08/23/2014  Referral date to RadOnc/MedOnc [phone]  Navigator Encounter Type Clinic/MDC  Patient Visit Type Initial  Treatment Phase Treatment planning  Barriers/Navigation Needs No barriers at this time  Interventions Education Method-provided Care Plan  Education Method Teach-back  Support Groups/Services GI  Time Spent with Patient 15--will have his follow up with PCP and have CEA checked in 6 months. F/U on Hgb now with Dr. Inda Merlin.

## 2014-08-23 NOTE — Therapy (Signed)
[facility] [address] [place], [place], [address] Phone: [phone]   Fax:  [phone]  Physical Therapy Treatment  Patient Details  Name: Mark Owen MRN: [id number] Date of Birth: Dec 04, 1950 Referring Provider:  [name], MD  Encounter Date: 08/23/2014      PT End of Session - 08/23/14 1117    Visit Number 1   PT Start Time 1000   PT Stop Time 1015   PT Time Calculation (min) 15 min   Activity Tolerance Patient tolerated treatment well   Behavior During Therapy [facility] for tasks assessed/performed      Past Medical History  Diagnosis Date  . Hypertension   . Complication of anesthesia     when had knee arthroscopy - had hard time waking up and breathing problems-would not take deep breaths to wake up  . Anemia   . Cancer     colon tumor with cancer cells    Past Surgical History  Procedure Laterality Date  . Shoulder arthroscopy w/ rotator cuff repair Bilateral   . Colonoscopy with propofol N/A 07/15/2014    Procedure: COLONOSCOPY WITH PROPOFOL;  Surgeon: Garlan Fair, MD;  Location: [facility];  Service: Endoscopy;  Laterality: N/A;  . Esophagogastroduodenoscopy (egd) with propofol N/A 07/15/2014    Procedure: ESOPHAGOGASTRODUODENOSCOPY (EGD) WITH PROPOFOL;  Surgeon: Garlan Fair, MD;  Location: [facility];  Service: Endoscopy;  Laterality: N/A;  . Knee arthroscopy    . Epidural steroid injections      for back  . Umbilical hernia repair  08/08/2014    Procedure: HERNIA REPAIR UMBILICAL ADULT;  Surgeon: [name], MD;  Location: [facility];  Service: General;;    There were no vitals filed for this visit.  Visit Diagnosis:  Colon cancer - Plan: PT plan of care cert/re-cert      Subjective Assessment - 08/23/14 1031    Subjective Patient is a 64 year old male with Stage II colon cancer.  Patient had surgery on 08/08/2014 for a robotic right colectomy and hernia repair. Patient  was in the hospital for  1 day.  Patient reports no pain.   Patient is accompained by: Family member   Currently in Pain? No/denies            [facility] PT Assessment - 08/23/14 0001    Assessment   Medical Diagnosis Stage II Colon Cancer   Onset Date 08/08/14   Prior Therapy None   Precautions   Precautions Other (comment)   Precaution Comments No ultrasound   Balance Screen   Has the patient fallen in the past 6 months No   Has the patient had a decrease in activity level because of a fear of falling?  No   Is the patient reluctant to leave their home because of a fear of falling?  No   Prior Function   Level of Independence Independent with basic ADLs   Observation/Other Assessments   Observations 4 surgical sites on the abdomen that are healing   Skin Integrity intact   Other Surveys  Select  Therapist discretion CI due to patient activity level    AROM   Lumbar Extension decreased by 50%   Palpation   Palpation Decreased mobility of surgical sites   Transfers   Transfers --  get up and down from a chair with armrests   Ambulation/Gait   Ambulation Surface Level   Gait Comments walks 1 mile per day  [facility] PT Treatment/Exercise - 08/23/14 0001    Exercises   Exercises --  lumbar extension in 6-8 weeks to increase lumbar ext.   Manual Therapy   Manual therapy comments educated patient on how to perform transverse friction massage to incisions once they are healed to improve mobility and thickening of the tissue                PT Education - 08/23/14 1116    Education provided Yes   Education Details walking program, Tips to conserve energy, Scar tissue massage, ways to adapt bathhroom using handrails, trunk extension after 6-8 weeks   Person(s) Educated Patient;Spouse   Methods Explanation;Demonstration;Tactile cues;Verbal cues;Handout   Comprehension Returned demonstration;Verbalized understanding              PT Long Term Goals - 08/23/14 1123    PT LONG TERM GOAL #1   Title educated on walking program, scar massage, and energy conservation and verbally understands.    Time 1   Period Days   Status Achieved               Plan - 08/23/14 1118    Clinical Impression Statement Patient is a 64 year old male with stage II Colon cancer.  Patient is S/P robotic right coloectomy and hernia repair o 08/08/2014 with 1 day stay.  Patient has 4 surgical sites healing well.  Patient lumbar extension decresaed by 50%. Patient presently is walking 1 mile.  Patient has to use his hands to go from sit to stand due weakness.  Patient reports no pain. Patient benefits from physical therapy to educated on energy conservation, walking program, scar massage.    Pt will benefit from skilled therapeutic intervention in order to improve on the following deficits Decreased activity tolerance;Decreased scar mobility;Decreased endurance   Rehab Potential Excellent   PT Frequency 1x / week   PT Duration --  1 time visit   PT Treatment/Interventions Patient/family education;Therapeutic activities;Manual techniques;Functional mobility training   PT Next Visit Plan 1 time visit in GI clinic.  If patient still needs therapy he can be referred to physical therapy.    PT Home Exercise Plan current HEP   Recommended Other Services None   Consulted and Agree with Plan of Care Patient        Problem List Patient Active Problem List   Diagnosis Date Noted  . Colon cancer 08/08/2014    Riyah Bardon,PT 08/23/2014, 11:29 AM  [facility] [address], [place] , [place], [address] Phone: [phone]   Fax:  [phone]

## 2014-08-23 NOTE — Progress Notes (Signed)
[place] Patient Consult   Referring MD: Asaph Serena 64 y.o.  Apr 10, 1950    Reason for Referral: Colon cancer   HPI: Mark Owen saw Dr. Inda Merlin for a routine physical and was noted to have iron deficiency anemia. Mark Owen was referred to Dr. Wynetta Emery was taken to a colonoscopy and upper endoscopy 07/15/2014. Biopsies were taken from the stomach to rule out H. pylori. In the proximal transverse colon near the hepatic flexure a circumferential tumor was consistent with a primary adenocarcinoma the colon. The area was tattooed and biopsies were performed. A polyp was removed from the mid ascending colon. The stomach biopsy returned positive for H. pylori. No dysplasia or malignancy. The ascending colon polyp returned as a tubular adenoma without high-grade dysplasia or malignancy. Biopsy from the proximal transverse colon mass returned as adenocarcinoma.  Mark Owen was referred to Dr. Marcello Moores and was taken to the operating room 08/08/2014 for a robotic assisted right colectomy and umbilical hernia repair. A mass was noted at the hepatic flexure. No sign of metastatic disease.  The pathology ([id number]) revealed invasive well-differentiated adenocarcinoma measuring 5.5 cm. Tumor invaded through the muscular propria to involve the underlying. Colorectal soft tissue. The resection margins were negative. 12 lymph nodes were negative for metastatic disease. No lymphovascular or perineural invasion. No tumor perforation. No loss of mismatch repair protein expression. The tumor returned microsatellite instability-stable.  The postoperative course was complicated by a prolonged ileus.  Past Medical History  Diagnosis Date  . Hypertension   . Complication of anesthesia     when had knee arthroscopy - had hard time waking up and breathing problems-would not take deep breaths to wake up  . Anemia-microcytic   April 2016   . Cancer-right colon, stage II (T3 N0)   April 2016          Past Surgical History  Procedure Laterality Date  . Shoulder arthroscopy w/ rotator cuff repair Bilateral   . Colonoscopy with propofol N/A 07/15/2014    Procedure: COLONOSCOPY WITH PROPOFOL;  Surgeon: Garlan Fair, MD;  Location: [facility];  Service: Endoscopy;  Laterality: N/A;  . Esophagogastroduodenoscopy (egd) with propofol N/A 07/15/2014    Procedure: ESOPHAGOGASTRODUODENOSCOPY (EGD) WITH PROPOFOL;  Surgeon: Garlan Fair, MD;  Location: [facility];  Service: Endoscopy;  Laterality: N/A;  . Knee arthroscopy    . Epidural steroid injections      for back  . Umbilical hernia repair  08/08/2014    Procedure: HERNIA REPAIR UMBILICAL ADULT;  Surgeon: [name], MD;  Location: [facility];  Service: General;;    Medications: Reviewed  Allergies:  Allergies  Allergen Reactions  . Penicillins     Black out    Family history: A maternal aunts had "stomach "cancer. A paternal great uncle had prostate cancer. No other family history of cancer.   Social History:   Mark Owen lives in [place]. Mark Owen is retired from [employer] and [employer]. Mark Owen does not use cigarettes or alcohol. No risk factor for HIV or hepatitis.   ROS:   Positives include: None  A complete ROS was otherwise negative.  Physical Exam:  Blood pressure 135/75, pulse 69, temperature 97.8 F (36.6 C), temperature source Oral, resp. rate 18, height 6' (1.829 m), weight 175 lb 8 oz (79.606 kg), SpO2 100 %.  HEENT: Oropharynx without visible mass, neck without mass Lungs: Clear bilaterally Cardiac: Regular rate and rhythm Abdomen: No hepatosplenomegaly, healed surgical incisions, no mass GU: Testes without mass  Vascular: No leg edema Lymph nodes: No cervical, supraclavicular, axillary, or inguinal nodes Neurologic: Alert and oriented, the motor exam appears intact in the upper and lower extremities Skin: No rash Musculoskeletal: No spine tenderness   LAB:  CBC  Lab Results  Component Value Date   WBC  10.0 08/15/2014   HGB 11.9* 08/15/2014   HCT 37.9* 08/15/2014   MCV 74.5* 08/15/2014   PLT 276 08/15/2014     CMP      Component Value Date/Time   NA 132* 08/17/2014 0601   K 3.9 08/17/2014 0601   CL 98* 08/17/2014 0601   CO2 23 08/17/2014 0601   GLUCOSE 83 08/17/2014 0601   BUN 22* 08/17/2014 0601   CREATININE 1.30* 08/17/2014 0601   CALCIUM 8.8* 08/17/2014 0601   GFRNONAA 57* 08/17/2014 0601   GFRAA >60 08/17/2014 0601   07/26/2014-CEA   0.4   Imaging:  CTs of the chest, abdomen, and pelvis on 07/22/2014-liver within normal limits, 3 x 3.2 x 4.1 cm mass in the ascending colon near the hepatic flexure, no lymphadenopathy, 6 x 4 mm subpleural nodule in the left lower lobe   Assessment/Plan:   1. 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 Iron deficiency anemia April 2016  3.   Positive H. pylori stain on a gastric biopsy 07/15/2014  4.   Left lower lobe subpleural nodule on the staging chest CT 07/22/2014, 1 year follow-up recommended   Disposition:   Mark Owen has been diagnosed with stage II colon cancer. We reviewed the details of the surgical pathology report and discussed the prognosis/adjuvant treatment options. Mark Owen has a good prognosis for a long-term disease-free survival. His tumor does not have "high risk "features. We discussed the lack of a clear benefit with adjuvant chemotherapy in patients with resected stage II colon cancer. I do not recommend adjuvant chemotherapy in his case.  Mark Owen does not appear to have a hereditary colon cancer syndrome, but his family members remained in increase risk of developing colon cancer. Mark Owen will alert his family members so they can receive appropriate screening. We discussed diet and exercise maneuvers that may decrease the risk of developing colon cancer.  We also discussed preliminary data suggesting aspirin therapy may decrease the risk of developing colon cancer. Aspirin therapy is not yet a standard adjuvant treatment recommendation.  [name] would like to continue clinical follow-up with Dr. Inda Merlin. Mark Owen should have a one-year colonoscopy. I recommend Dr. Inda Merlin checked a CEA every 6 months for the next 2-3 years. Mark Owen should have a one-year follow-up noncontrast chest CT to evaluate the lung nodule seen on the staging chest CT.  Mr. Scow is not scheduled for a follow-up appointment at the [facility]. I am available to see him in the future as needed.  [place], [place] 08/23/2014, 2:12 PM

## 2014-08-23 NOTE — Progress Notes (Deleted)
  [facility] OFFICE PROGRESS NOTE   Diagnosis:   INTERVAL HISTORY:   ***  Objective:  Vital signs in last 24 hours:  Blood pressure 135/75, pulse 69, temperature 97.8 F (36.6 C), temperature source Oral, resp. rate 18, height 6' (1.829 m), weight 175 lb 8 oz (79.606 kg), SpO2 100 %.    HEENT: *** Lymphatics: *** Resp: *** Cardio: *** GI: *** Vascular: *** Neuro:***  Skin:***   Portacath/PICC-without erythema  Lab Results:  Lab Results  Component Value Date   WBC 10.0 08/15/2014   HGB 11.9* 08/15/2014   HCT 37.9* 08/15/2014   MCV 74.5* 08/15/2014   PLT 276 08/15/2014    Lab Results  Component Value Date   NA 132* 08/17/2014    No results found for: CEA  Imaging:  No results found.  Medications: I have reviewed the patient's current medications.  Assessment/Plan:  ***  Disposition:  Betsy Coder, MD  08/23/2014  2:06 PM

## 2014-08-23 NOTE — Progress Notes (Signed)
Patient was seen in GI clinic.  64 year old male diagnosed with colon cancer status post right colectomy.  Patient past medical history includes ileus, hypertension, and anemia.  Medications include Dulcolax, vitamin D3, iron, multivitamin, and saw [place].  Labs include sodium 132, BUN 22, creatinine 1.3 on May 14.  Height: 6 feet 0 inches. Weight: 175.5 pounds. Usual body weight: 186 pounds. BMI: 23.8.  Patient reports he has been following a bland diet per M.D. instructions. He denies nutrition impact symptoms. Reports M.D. has told him he is healing well. Endorses 14 pound weight loss after hospital stay for colectomy and ileus.  Nutrition diagnosis:  Food and nutrition related knowledge deficit related to new diagnosis of colon cancer and associated treatments as evidenced by no prior need for nutrition related information.  Intervention:  Educated patient on foods permitted on a bland diet. Recommended small frequent meals and snacks. Encouraged high-protein diet for improved healing. When diet advances, recommended patient begin slowly and add high fiber foods every few days. Provided fact sheet on ways to increase calories and protein.   Answered questions.  Teach back method used.  Contact information was given.  Monitoring, evaluation, goals:  Patient will tolerate adequate calories and protein to promote healing and minimize weight loss.  Next visit to be scheduled as needed.  **Disclaimer: This note was dictated with voice recognition software. Similar sounding words can inadvertently be transcribed and this note may contain transcription errors which may not have been corrected upon publication of note.**

## 2014-08-30 ENCOUNTER — Telehealth: Payer: Self-pay | Admitting: *Deleted

## 2014-08-30 NOTE — Telephone Encounter (Signed)
Oncology Nurse Navigator Documentation  Oncology Nurse Navigator Flowsheets 08/23/2014 08/30/2014  Referral date to RadOnc/MedOnc 08/21/2014 -  Navigator Encounter Type Clinic/MDC Telephone- 1 week F/U  Patient Visit Type Initial -  Treatment Phase Treatment Observation-no tx  Barriers/Navigation Needs No barriers at this time No barriers at this time-Reports he is getting more active, eating well. No complaints.  Interventions Education Method -  Education Method Teach-back -  Support Groups/Services GI -  Time Spent with Patient 15 -

## 2015-02-20 ENCOUNTER — Telehealth: Payer: Self-pay | Admitting: *Deleted

## 2015-02-20 NOTE — Telephone Encounter (Signed)
Received faxed copy of 11/9 CEA: 3.6. Left message on voicemail for Dr. Inda Merlin' RN: Dr. Benay Spice recommends repeat CEA in 3 mos. CEA upper normal, was 0.4 pre-op. We can see pt here PRN, per MD.

## 2015-05-15 ENCOUNTER — Encounter (HOSPITAL_COMMUNITY): Payer: Self-pay | Admitting: Emergency Medicine

## 2015-05-15 ENCOUNTER — Observation Stay (HOSPITAL_COMMUNITY)
Admission: EM | Admit: 2015-05-15 | Discharge: 2015-05-18 | Disposition: A | Discharge status: Home / Self Care | Payer: 59 | Attending: Internal Medicine | Admitting: Internal Medicine

## 2015-05-15 DIAGNOSIS — R9431 Abnormal electrocardiogram [ECG] [EKG]: Secondary | ICD-10-CM | POA: Diagnosis not present

## 2015-05-15 DIAGNOSIS — Z833 Family history of diabetes mellitus: Secondary | ICD-10-CM | POA: Insufficient documentation

## 2015-05-15 DIAGNOSIS — Z85038 Personal history of other malignant neoplasm of large intestine: Secondary | ICD-10-CM | POA: Diagnosis not present

## 2015-05-15 DIAGNOSIS — E871 Hypo-osmolality and hyponatremia: Secondary | ICD-10-CM | POA: Insufficient documentation

## 2015-05-15 DIAGNOSIS — Z79899 Other long term (current) drug therapy: Secondary | ICD-10-CM | POA: Insufficient documentation

## 2015-05-15 DIAGNOSIS — N189 Chronic kidney disease, unspecified: Secondary | ICD-10-CM | POA: Insufficient documentation

## 2015-05-15 DIAGNOSIS — E1165 Type 2 diabetes mellitus with hyperglycemia: Secondary | ICD-10-CM | POA: Insufficient documentation

## 2015-05-15 DIAGNOSIS — R739 Hyperglycemia, unspecified: Secondary | ICD-10-CM

## 2015-05-15 DIAGNOSIS — N179 Acute kidney failure, unspecified: Secondary | ICD-10-CM | POA: Diagnosis not present

## 2015-05-15 DIAGNOSIS — D649 Anemia, unspecified: Secondary | ICD-10-CM | POA: Insufficient documentation

## 2015-05-15 DIAGNOSIS — R0981 Nasal congestion: Secondary | ICD-10-CM

## 2015-05-15 DIAGNOSIS — E1122 Type 2 diabetes mellitus with diabetic chronic kidney disease: Secondary | ICD-10-CM | POA: Insufficient documentation

## 2015-05-15 DIAGNOSIS — E1100 Type 2 diabetes mellitus with hyperosmolarity without nonketotic hyperglycemic-hyperosmolar coma (NKHHC): Secondary | ICD-10-CM | POA: Diagnosis not present

## 2015-05-15 DIAGNOSIS — I129 Hypertensive chronic kidney disease with stage 1 through stage 4 chronic kidney disease, or unspecified chronic kidney disease: Secondary | ICD-10-CM | POA: Insufficient documentation

## 2015-05-15 DIAGNOSIS — Z9049 Acquired absence of other specified parts of digestive tract: Secondary | ICD-10-CM | POA: Insufficient documentation

## 2015-05-15 DIAGNOSIS — I10 Essential (primary) hypertension: Secondary | ICD-10-CM

## 2015-05-15 DIAGNOSIS — N289 Disorder of kidney and ureter, unspecified: Secondary | ICD-10-CM

## 2015-05-15 DIAGNOSIS — E875 Hyperkalemia: Secondary | ICD-10-CM | POA: Diagnosis present

## 2015-05-15 LAB — GLUCOSE, CAPILLARY: Glucose-Capillary: 324 mg/dL — ABNORMAL HIGH (ref 65–99)

## 2015-05-15 LAB — URINALYSIS, ROUTINE W REFLEX MICROSCOPIC
BILIRUBIN URINE: NEGATIVE
Glucose, UA: >1000 mg/dL — AB
Hgb urine dipstick: NEGATIVE
Ketones, ur: NEGATIVE mg/dL
Leukocytes, UA: NEGATIVE
Nitrite: NEGATIVE
PH: 5.5 (ref 5.0–8.0)
Protein, ur: NEGATIVE mg/dL
SPECIFIC GRAVITY, URINE: 1.028 (ref 1.005–1.030)

## 2015-05-15 LAB — BASIC METABOLIC PANEL
Anion gap: 13 (ref 5–15)
BUN: 37 mg/dL — AB (ref 6–20)
CO2: 21 mmol/L — AB (ref 22–32)
CREATININE: 1.98 mg/dL — AB (ref 0.61–1.24)
Calcium: 9.6 mg/dL (ref 8.9–10.3)
Chloride: 89 mmol/L — ABNORMAL LOW (ref 101–111)
GFR calc Af Amer: 40 mL/min — ABNORMAL LOW (ref >60)
GFR, EST NON AFRICAN AMERICAN: 34 mL/min — AB (ref >60)
GLUCOSE: 810 mg/dL — AB (ref 65–99)
Potassium: 6.2 mmol/L (ref 3.5–5.1)
Sodium: 123 mmol/L — ABNORMAL LOW (ref 135–145)

## 2015-05-15 LAB — CBC
HEMATOCRIT: 47.7 % (ref 39.0–52.0)
Hemoglobin: 16.4 g/dL (ref 13.0–17.0)
MCH: 26.7 pg (ref 26.0–34.0)
MCHC: 34.4 g/dL (ref 30.0–36.0)
MCV: 77.6 fL — ABNORMAL LOW (ref 78.0–100.0)
Platelets: 248 10*3/uL (ref 150–400)
RBC: 6.15 MIL/uL — AB (ref 4.22–5.81)
RDW: 12.8 % (ref 11.5–15.5)
WBC: 9 10*3/uL (ref 4.0–10.5)

## 2015-05-15 LAB — URINE MICROSCOPIC-ADD ON: RBC / HPF: NONE SEEN RBC/hpf (ref 0–5)

## 2015-05-15 LAB — CBG MONITORING, ED: Glucose-Capillary: 296 mg/dL — ABNORMAL HIGH (ref 65–99)

## 2015-05-15 MED ORDER — SODIUM CHLORIDE 0.9 % IV SOLN
INTRAVENOUS | Status: DC
  Administered 2015-05-15: via INTRAVENOUS

## 2015-05-15 MED ORDER — ACETAMINOPHEN 325 MG PO TABS
650.0000 mg | ORAL_TABLET | Freq: Four times a day (QID) | ORAL | Status: DC | PRN
Start: 2015-05-15 — End: 2015-05-18
  Administered 2015-05-17: 650 mg via ORAL
  Filled 2015-05-15: qty 2

## 2015-05-15 MED ORDER — LORATADINE 10 MG PO TABS
10.0000 mg | ORAL_TABLET | Freq: Every day | ORAL | Status: DC
  Administered 2015-05-16 – 2015-05-18 (×3): 10 mg via ORAL
  Filled 2015-05-15 (×3): qty 1

## 2015-05-15 MED ORDER — ACETAMINOPHEN 650 MG RE SUPP
650.0000 mg | Freq: Four times a day (QID) | RECTAL | Status: DC | PRN
Start: 2015-05-15 — End: 2015-05-18

## 2015-05-15 MED ORDER — ONDANSETRON HCL 4 MG/2ML IJ SOLN: 4.0000 mg | Freq: Four times a day (QID) | INTRAMUSCULAR | Status: DC | PRN

## 2015-05-15 MED ORDER — SODIUM CHLORIDE 0.9% FLUSH
3.0000 mL | Freq: Two times a day (BID) | INTRAVENOUS | Status: DC
  Administered 2015-05-16 – 2015-05-18 (×5): 3 mL via INTRAVENOUS

## 2015-05-15 MED ORDER — INSULIN ASPART 100 UNIT/ML ~~LOC~~ SOLN
0.0000 [IU] | SUBCUTANEOUS | Status: DC
  Administered 2015-05-15: 7 [IU] via SUBCUTANEOUS
  Administered 2015-05-16: 2 [IU] via SUBCUTANEOUS

## 2015-05-15 MED ORDER — ONDANSETRON HCL 4 MG PO TABS: 4.0000 mg | ORAL_TABLET | Freq: Four times a day (QID) | ORAL | Status: DC | PRN

## 2015-05-15 MED ORDER — HEPARIN SODIUM (PORCINE) 5000 UNIT/ML IJ SOLN
5000.0000 [IU] | Freq: Three times a day (TID) | INTRAMUSCULAR | Status: DC
  Administered 2015-05-15 – 2015-05-16 (×3): 5000 [IU] via SUBCUTANEOUS
  Filled 2015-05-15 (×3): qty 1

## 2015-05-15 MED ORDER — ALBUTEROL SULFATE (2.5 MG/3ML) 0.083% IN NEBU
5.0000 mg | INHALATION_SOLUTION | Freq: Once | RESPIRATORY_TRACT | Status: AC
  Administered 2015-05-15: 5 mg via RESPIRATORY_TRACT
  Filled 2015-05-15: qty 6

## 2015-05-15 MED ORDER — TETRAHYDROZOLINE HCL 0.05 % OP SOLN
1.0000 [drp] | Freq: Every day | OPHTHALMIC | Status: DC | PRN
Start: 2015-05-15 — End: 2015-05-18

## 2015-05-15 MED ORDER — VITAMIN D3 25 MCG (1000 UNIT) PO TABS
1000.0000 [IU] | ORAL_TABLET | Freq: Every day | ORAL | Status: DC
  Administered 2015-05-16 – 2015-05-18 (×3): 1000 [IU] via ORAL
  Filled 2015-05-15 (×6): qty 1

## 2015-05-15 MED ORDER — DIPHENHYDRAMINE HCL 25 MG PO CAPS
25.0000 mg | ORAL_CAPSULE | Freq: Every day | ORAL | Status: DC | PRN
  Filled 2015-05-15: qty 1

## 2015-05-15 MED ORDER — SODIUM CHLORIDE 0.9 % IV BOLUS (SEPSIS)
1000.0000 mL | Freq: Once | INTRAVENOUS | Status: AC
  Administered 2015-05-15: 1000 mL via INTRAVENOUS

## 2015-05-15 MED ORDER — ONDANSETRON HCL 4 MG/2ML IJ SOLN: 4.0000 mg | Freq: Three times a day (TID) | INTRAMUSCULAR | Status: AC | PRN

## 2015-05-15 MED ORDER — INSULIN ASPART 100 UNIT/ML ~~LOC~~ SOLN
10.0000 [IU] | Freq: Once | SUBCUTANEOUS | Status: AC
  Administered 2015-05-15: 10 [IU] via INTRAVENOUS
  Filled 2015-05-15: qty 1

## 2015-05-15 MED ORDER — FERROUS GLUCONATE 324 (38 FE) MG PO TABS
325.0000 mg | ORAL_TABLET | Freq: Every day | ORAL | Status: DC
  Administered 2015-05-16 – 2015-05-17 (×2): 324 mg via ORAL
  Administered 2015-05-18: 325 mg via ORAL
  Filled 2015-05-15 (×3): qty 2

## 2015-05-15 NOTE — H&P (Addendum)
[facility] History and Physical  Mark Owen [id number] DOB: 1951/03/23 DOA: 05/15/2015  Referring physician: ED PCP: Henrine Screws, MD   Chief Complaint: High blood sugars  HPI:  Mark Owen is a 65 year old male with past medical history of Htn, anemia, and stage II colon cancer s/p right colectomy; who presents after being advised by his primary care office that his blood sugars were high. Patient noticed symptoms started the week of January 20th while walking in [employer] he noted feeling lightheaded and notes almost passing out. Along the same time he noted increased frequency of urination, increased thirst, bitter taste in back of his throat, nausea. Patient denies any vomiting, chest pain, diarrhea. Thereafter patient notes that he had been having symptoms of nasal congestion and went to see his primary care office 8 days ago. At that time he was diagnosed with a sinus infection and given a Z-Pak. After completing the Z-Pak he still complained of sinus congestion. He notes utilizing Sudafed off and on during this period. He denies any significant cough. Due to issues that he's been having with the pressure tested the back of his throat and nausea he's been eating chicken broth, juices, and had a milkshake prior to coming into the emergency department today. Patient notes a strong family history of diabetes. He himself had been told that his blood glucoses have been creeping up on his yearly visits. Prior to admission he had been to his primary care office where they did blood work and they had called him today to tell him that his blood glucose was elevated at 710.    Review of Systems  Constitutional: Positive for malaise/fatigue. Negative for chills and weight loss.  HENT: Positive for sore throat. Negative for tinnitus.        Bitter taste in mouth  Eyes: Negative for photophobia and pain.  Respiratory: Negative for sputum production and shortness of breath.    Cardiovascular: Negative for palpitations and leg swelling.  Gastrointestinal: Positive for nausea. Negative for vomiting and abdominal pain.  Genitourinary: Positive for frequency. Negative for dysuria, urgency and flank pain.  Musculoskeletal: Negative for back pain and joint pain.  Skin: Negative for itching and rash.  Neurological: Negative for focal weakness and seizures.  Endo/Heme/Allergies: Positive for polydipsia.  Psychiatric/Behavioral: Negative for hallucinations and substance abuse.       Past Medical History  Diagnosis Date  . Hypertension   . Complication of anesthesia     when had knee arthroscopy - had hard time waking up and breathing problems-would not take deep breaths to wake up  . Anemia   . Cancer [facility])     colon tumor with cancer cells     Past Surgical History  Procedure Laterality Date  . Shoulder arthroscopy w/ rotator cuff repair Bilateral   . Colonoscopy with propofol N/A 07/15/2014    Procedure: COLONOSCOPY WITH PROPOFOL;  Surgeon: Garlan Fair, MD;  Location: [facility];  Service: Endoscopy;  Laterality: N/A;  . Esophagogastroduodenoscopy (egd) with propofol N/A 07/15/2014    Procedure: ESOPHAGOGASTRODUODENOSCOPY (EGD) WITH PROPOFOL;  Surgeon: Garlan Fair, MD;  Location: [facility];  Service: Endoscopy;  Laterality: N/A;  . Knee arthroscopy    . Epidural steroid injections      for back  . Umbilical hernia repair  08/08/2014    Procedure: HERNIA REPAIR UMBILICAL ADULT;  Surgeon: [name], MD;  Location: [facility]S;  Service: General;;      Social History:  reports that he  has never smoked. He does not have any smokeless tobacco history on file. He reports that he does not drink alcohol or use illicit drugs.   Allergies  Allergen Reactions  . Penicillins     Black out Has patient had a PCN reaction causing immediate rash, facial/tongue/throat swelling, SOB or lightheadedness with hypotension: no Has patient had a PCN reaction  causing severe rash involving mucus membranes or skin necrosis: No Has patient had a PCN reaction that required hospitalization Unknown Has patient had a PCN reaction occurring within the last 10 years: NO If all of the above answers are "NO", then may proceed with Cephalosporin use.     History reviewed. No pertinent family history.    Prior to Admission medications   Medication Sig Start Date End Date Taking? Authorizing Provider  anti-nausea (EMETROL) solution Take 10 mLs by mouth every 15 (fifteen) minutes as needed for nausea or vomiting.   Yes Historical Provider, MD  Cholecalciferol (VITAMIN D3 PO) Take 1 tablet by mouth daily.   Yes Historical Provider, MD  diphenhydrAMINE (BENADRYL) 25 MG tablet Take 25 mg by mouth daily as needed for allergies.   Yes Historical Provider, MD  ferrous gluconate (FERGON) 325 MG tablet Take 325 mg by mouth daily. 03/09/15  Yes Historical Provider, MD  fexofenadine (ALLEGRA) 180 MG tablet Take 180 mg by mouth daily as needed for allergies or rhinitis.   Yes Historical Provider, MD  ibuprofen (ADVIL,MOTRIN) 200 MG tablet Take 400 mg by mouth every 6 (six) hours as needed for headache or moderate pain.   Yes Historical Provider, MD  lisinopril-hydrochlorothiazide (PRINZIDE,ZESTORETIC) 10-12.5 MG per tablet Take 0.5 tablets by mouth every morning.    Yes Historical Provider, MD  Multiple Vitamin (MULTIVITAMIN WITH MINERALS) TABS tablet Take 1 tablet by mouth every morning.    Yes Historical Provider, MD  pseudoephedrine (SUDAFED) 120 MG 12 hr tablet Take 120 mg by mouth every 12 (twelve) hours as needed for congestion.   Yes Historical Provider, MD  pseudoephedrine (SUDAFED) 30 MG tablet Take 30 mg by mouth every 4 (four) hours as needed for congestion.   Yes Historical Provider, MD  Saw Palmetto, Serenoa repens, (SAW PALMETTO PO) Take 1 tablet by mouth daily.   Yes Historical Provider, MD  Tetrahydrozoline HCl (VISINE OP) Apply 1-52 drops to eye daily as  needed (dry eyes.).   Yes Historical Provider, MD     Physical Exam: Filed Vitals:   05/15/15 1946 05/15/15 2142 05/15/15 2200 05/15/15 2230  BP: 135/96 119/83 135/80 120/97  Pulse: 90  87 122  Temp: 97.5 F (36.4 C)     TempSrc: Oral     Resp: 11 23 10 22   SpO2: 94%  100% 96%     Constitutional: Vital signs reviewed. Patient is a well-developed and well-nourished in no acute distress and cooperative with exam. Alert and oriented x3.  Head: Normocephalic and atraumatic  Ear: TM normal bilaterally  Mouth: no erythema or exudates dry mucous membranes Eyes: PERRL, EOMI, conjunctivae normal, No scleral icterus.  Neck: Supple, Trachea midline normal ROM, No JVD, mass, thyromegaly, or carotid bruit present.  Cardiovascular: RRR, S1 normal, S2 normal, no MRG, pulses symmetric and intact bilaterally  Pulmonary/Chest: CTAB, no wheezes, rales, or rhonchi  Abdominal: Soft. Non-tender, non-distended, bowel sounds are normal, no masses, organomegaly, or guarding present. Healed abdominal scar from previous surgery GU: no CVA tenderness Musculoskeletal: No joint deformities, erythema, or stiffness, ROM full and no nontender Ext: no edema and no  cyanosis, pulses palpable bilaterally (DP and PT)  Hematology: no cervical, inginal, or axillary adenopathy.  Neurological: A&O x3, Strenght is normal and symmetric bilaterally, cranial nerve II-XII are grossly intact, no focal motor deficit, sensory intact to light touch bilaterally.  Skin: Warm, dry and intact. No rash, cyanosis, or clubbing.  Psychiatric: Normal mood and affect. speech and behavior is normal. Judgment and thought content normal. Cognition and memory are normal.      Data Review   Micro Results No results found for this or any previous visit (from the past 240 hour(s)).  Radiology Reports No results found.   CBC  Recent Labs Lab 05/15/15 1917  WBC 9.0  HGB 16.4  HCT 47.7  PLT 248  MCV 77.6*  MCH 26.7  MCHC 34.4  RDW  12.8    Chemistries   Recent Labs Lab 05/15/15 2024  NA 123*  K 6.2*  CL 89*  CO2 21*  GLUCOSE 810*  BUN 37*  CREATININE 1.98*  CALCIUM 9.6   ------------------------------------------------------------------------------------------------------------------ CrCl cannot be calculated (Unknown ideal weight.). ------------------------------------------------------------------------------------------------------------------ No results for input(s): HGBA1C in the last 72 hours. ------------------------------------------------------------------------------------------------------------------ No results for input(s): CHOL, HDL, LDLCALC, TRIG, CHOLHDL, LDLDIRECT in the last 72 hours. ------------------------------------------------------------------------------------------------------------------ No results for input(s): TSH, T4TOTAL, T3FREE, THYROIDAB in the last 72 hours.  Invalid input(s): FREET3 ------------------------------------------------------------------------------------------------------------------ No results for input(s): VITAMINB12, FOLATE, FERRITIN, TIBC, IRON, RETICCTPCT in the last 72 hours.  Coagulation profile No results for input(s): INR, PROTIME in the last 168 hours.  No results for input(s): DDIMER in the last 72 hours.  Cardiac Enzymes No results for input(s): CKMB, TROPONINI, MYOGLOBIN in the last 168 hours.  Invalid input(s): CK ------------------------------------------------------------------------------------------------------------------ Invalid input(s): POCBNP   CBG:  Recent Labs Lab 05/15/15 1800 05/15/15 2256  GLUCAP >600* 296*          Assessment/Plan  Type 2 diabetes mellitus with hyperosmolarity without nonketotic hyperglycemic-hyperosmolar coma (nkhhc): Acute. Patient with no previous history diabetes with blood glucose of 810 on admission, normal anion gap, and negative for ketones on UA.  - Admit to telemetry bed - Nothing  by mouth overnight - IV fluids of normal saline - CBGs every 4 hours with scale insulin - Diabetic education in a.m. - Check hemoglobin A1c and lipid panel   Hyperkalemia: Acute potassium 6.2 on admission. Patient was given IV fluids, albuterol treatment, and 10 units of insulin. - Recheck BMP now - will reassess and treat as needed  Suspected Acute kidney injury on chronic kidney disease : Patient's creatinine elevated at 1.98 on admission previous lab results back in 2016 show a creatinine of 1.30. - IV fluids - Recheck BMP in a.m. - Renal ultrasound   Sinus congestion: Patient has been using Sudafed off and on. Suspect that he possibly could be having rebound congestion. - Recommend the patient to discontinue Sudafed - Mucinex    Code Status:   full Family Communication: bedside Disposition Plan: admit   Total time spent 55 minutes.Greater than 50% of this time was spent in counseling, explanation of diagnosis, planning of further management, and coordination of care  [facility] Pager [phone]  If 7PM-7AM, please contact night-coverage [email or web address] Password [email or web address] 05/15/2015, 11:14 PM

## 2015-05-15 NOTE — ED Provider Notes (Signed)
CSN: [id number]     Arrival date & time 05/15/15  1739 History   First MD Initiated Contact with Patient 05/15/15 1841     Chief Complaint  Patient presents with  . Hyperglycemia      HPI Pt sent from [facility] for CBG >700, pt went to [facility] for polydipsia, polyuria, weakness and lightheadedness, bad taste in mouth onset 04-25-15. Pt had gone to [place] last week and was diagnosed with sinus infection, no improvement, so pt returned to [place] today. CBG in triage is > 600. Past Medical History  Diagnosis Date  . Hypertension   . Complication of anesthesia     when had knee arthroscopy - had hard time waking up and breathing problems-would not take deep breaths to wake up  . Anemia   . Cancer [facility])     colon tumor with cancer cells   Past Surgical History  Procedure Laterality Date  . Shoulder arthroscopy w/ rotator cuff repair Bilateral   . Colonoscopy with propofol N/A 07/15/2014    Procedure: COLONOSCOPY WITH PROPOFOL;  Surgeon: Garlan Fair, MD;  [facility];  Service: Endoscopy;  Laterality: N/A;  . Esophagogastroduodenoscopy (egd) with propofol N/A 07/15/2014    Procedure: ESOPHAGOGASTRODUODENOSCOPY (EGD) WITH PROPOFOL;  Surgeon: Garlan Fair, MD;  [facility];  Service: Endoscopy;  Laterality: N/A;  . Knee arthroscopy    . Epidural steroid injections      for back  . Umbilical hernia repair  08/08/2014    Procedure: HERNIA REPAIR UMBILICAL ADULT;  Surgeon: [name], MD;  Location: [facility]S;  Service: General;;   History reviewed. No pertinent family history. Social History  Substance Use Topics  . Smoking status: Never Smoker   . Smokeless tobacco: None  . Alcohol Use: No    Review of Systems  Constitutional: Negative for fever.  Endocrine: Positive for polydipsia.  Genitourinary: Positive for frequency.  All other systems reviewed and are negative.     Allergies  Penicillins  Home Medications   Prior to Admission medications    Medication Sig Start Date End Date Taking? Authorizing Provider  anti-nausea (EMETROL) solution Take 10 mLs by mouth every 15 (fifteen) minutes as needed for nausea or vomiting.   Yes Historical Provider, MD  Cholecalciferol (VITAMIN D3 PO) Take 1 tablet by mouth daily.   Yes Historical Provider, MD  diphenhydrAMINE (BENADRYL) 25 MG tablet Take 25 mg by mouth daily as needed for allergies.   Yes Historical Provider, MD  ferrous gluconate (FERGON) 325 MG tablet Take 325 mg by mouth daily. 03/09/15  Yes Historical Provider, MD  fexofenadine (ALLEGRA) 180 MG tablet Take 180 mg by mouth daily as needed for allergies or rhinitis.   Yes Historical Provider, MD  ibuprofen (ADVIL,MOTRIN) 200 MG tablet Take 400 mg by mouth every 6 (six) hours as needed for headache or moderate pain.   Yes Historical Provider, MD  lisinopril-hydrochlorothiazide (PRINZIDE,ZESTORETIC) 10-12.5 MG per tablet Take 0.5 tablets by mouth every morning.    Yes Historical Provider, MD  Multiple Vitamin (MULTIVITAMIN WITH MINERALS) TABS tablet Take 1 tablet by mouth every morning.    Yes Historical Provider, MD  pseudoephedrine (SUDAFED) 120 MG 12 hr tablet Take 120 mg by mouth every 12 (twelve) hours as needed for congestion.   Yes Historical Provider, MD  pseudoephedrine (SUDAFED) 30 MG tablet Take 30 mg by mouth every 4 (four) hours as needed for congestion.   Yes Historical Provider, MD  Saw Palmetto, Serenoa repens, (SAW PALMETTO  PO) Take 1 tablet by mouth daily.   Yes Historical Provider, MD  Tetrahydrozoline HCl (VISINE OP) Apply 1-52 drops to eye daily as needed (dry eyes.).   Yes Historical Provider, MD   BP 119/83 mmHg  Pulse 90  Temp(Src) 97.5 F (36.4 C) (Oral)  Resp 23  SpO2 94% Physical Exam Physical Exam  Nursing note and vitals reviewed. Constitutional: He is oriented to person, place, and time. He appears well-developed and well-nourished. No distress.  HENT:  Head: Normocephalic and atraumatic.  Eyes: Pupils  are equal, round, and reactive to light.  Neck: Normal range of motion.  Cardiovascular: Normal rate and intact distal pulses.   Pulmonary/Chest: No respiratory distress.  Abdominal: Normal appearance. He exhibits no distension.  Musculoskeletal: Normal range of motion.  Neurological: He is alert and oriented to person, place, and time. No cranial nerve deficit.  Skin: Skin is warm and dry. No rash noted.  Psychiatric: He has a normal mood and affect. His behavior is normal.   ED Course  Procedures (including critical care time) Medications  sodium chloride 0.9 % bolus 1,000 mL (1,000 mLs Intravenous New Bag/Given 05/15/15 2114)  sodium chloride 0.9 % bolus 1,000 mL (0 mLs Intravenous Stopped 05/15/15 2114)  albuterol (PROVENTIL) (2.5 MG/3ML) 0.083% nebulizer solution 5 mg (5 mg Nebulization Given 05/15/15 2154)  insulin aspart (novoLOG) injection 10 Units (10 Units Intravenous Given 05/15/15 2154)    Labs Review Labs Reviewed  CBC - Abnormal; Notable for the following:    RBC 6.15 (*)    MCV 77.6 (*)    All other components within normal limits  URINALYSIS, ROUTINE W REFLEX MICROSCOPIC (NOT AT [facility]) - Abnormal; Notable for the following:    APPearance CLOUDY (*)    Glucose, UA >1000 (*)    All other components within normal limits  URINE MICROSCOPIC-ADD ON - Abnormal; Notable for the following:    Squamous Epithelial / LPF 0-5 (*)    Bacteria, UA RARE (*)    All other components within normal limits  BASIC METABOLIC PANEL - Abnormal; Notable for the following:    Sodium 123 (*)    Potassium 6.2 (*)    Chloride 89 (*)    CO2 21 (*)    Glucose, Bld 810 (*)    BUN 37 (*)    Creatinine, Ser 1.98 (*)    GFR calc non Af Amer 34 (*)    GFR calc Af Amer 40 (*)    All other components within normal limits  CBG MONITORING, ED - Abnormal; Notable for the following:    Glucose-Capillary >600 (*)    All other components within normal limits      MDM   Final diagnoses:  Hyperglycemia   Renal insufficiency  Hyperkalemia  Hyponatremia        Leonard Schwartz, MD 05/15/15 2208

## 2015-05-15 NOTE — ED Notes (Signed)
Notified RN,Tiffany pt. CBG 296.

## 2015-05-15 NOTE — ED Notes (Signed)
Dr. Smith at bedside.

## 2015-05-15 NOTE — ED Notes (Signed)
Pt sent from [facility] for CBG >700, pt went to [facility] for polydipsia, polyuria, weakness and lightheadedness, bad taste in mouth onset 04-25-15. Pt had gone to [place] last week and was diagnosed with sinus infection, no improvement, so pt returned to [place] today. CBG in triage is > 600.

## 2015-05-16 DIAGNOSIS — R0981 Nasal congestion: Secondary | ICD-10-CM | POA: Diagnosis not present

## 2015-05-16 DIAGNOSIS — N179 Acute kidney failure, unspecified: Secondary | ICD-10-CM | POA: Diagnosis present

## 2015-05-16 DIAGNOSIS — I129 Hypertensive chronic kidney disease with stage 1 through stage 4 chronic kidney disease, or unspecified chronic kidney disease: Secondary | ICD-10-CM | POA: Diagnosis not present

## 2015-05-16 DIAGNOSIS — E1100 Type 2 diabetes mellitus with hyperosmolarity without nonketotic hyperglycemic-hyperosmolar coma (NKHHC): Principal | ICD-10-CM

## 2015-05-16 DIAGNOSIS — E875 Hyperkalemia: Secondary | ICD-10-CM

## 2015-05-16 DIAGNOSIS — N189 Chronic kidney disease, unspecified: Secondary | ICD-10-CM | POA: Diagnosis not present

## 2015-05-16 DIAGNOSIS — I10 Essential (primary) hypertension: Secondary | ICD-10-CM | POA: Diagnosis not present

## 2015-05-16 LAB — CBC
HCT: 43.2 % (ref 39.0–52.0)
Hemoglobin: 14.3 g/dL (ref 13.0–17.0)
MCH: 25.9 pg — ABNORMAL LOW (ref 26.0–34.0)
MCHC: 33.1 g/dL (ref 30.0–36.0)
MCV: 78.3 fL (ref 78.0–100.0)
PLATELETS: 217 10*3/uL (ref 150–400)
RBC: 5.52 MIL/uL (ref 4.22–5.81)
RDW: 12.6 % (ref 11.5–15.5)
WBC: 8.2 10*3/uL (ref 4.0–10.5)

## 2015-05-16 LAB — BASIC METABOLIC PANEL
Anion gap: 10 (ref 5–15)
Anion gap: 12 (ref 5–15)
Anion gap: 9 (ref 5–15)
BUN: 27 mg/dL — AB (ref 6–20)
BUN: 29 mg/dL — AB (ref 6–20)
BUN: 33 mg/dL — ABNORMAL HIGH (ref 6–20)
CALCIUM: 9.5 mg/dL (ref 8.9–10.3)
CALCIUM: 9.6 mg/dL (ref 8.9–10.3)
CO2: 23 mmol/L (ref 22–32)
CO2: 23 mmol/L (ref 22–32)
CO2: 25 mmol/L (ref 22–32)
CREATININE: 1.37 mg/dL — AB (ref 0.61–1.24)
CREATININE: 1.62 mg/dL — AB (ref 0.61–1.24)
CREATININE: 1.74 mg/dL — AB (ref 0.61–1.24)
Calcium: 9 mg/dL (ref 8.9–10.3)
Chloride: 100 mmol/L — ABNORMAL LOW (ref 101–111)
Chloride: 101 mmol/L (ref 101–111)
Chloride: 104 mmol/L (ref 101–111)
GFR calc non Af Amer: 40 mL/min — ABNORMAL LOW (ref >60)
GFR calc non Af Amer: 44 mL/min — ABNORMAL LOW (ref >60)
GFR, EST AFRICAN AMERICAN: 46 mL/min — AB (ref >60)
GFR, EST AFRICAN AMERICAN: 51 mL/min — AB (ref >60)
GFR, EST NON AFRICAN AMERICAN: 53 mL/min — AB (ref >60)
Glucose, Bld: 261 mg/dL — ABNORMAL HIGH (ref 65–99)
Glucose, Bld: 340 mg/dL — ABNORMAL HIGH (ref 65–99)
Glucose, Bld: 388 mg/dL — ABNORMAL HIGH (ref 65–99)
Potassium: 4.6 mmol/L (ref 3.5–5.1)
Potassium: 4.7 mmol/L (ref 3.5–5.1)
Potassium: 4.7 mmol/L (ref 3.5–5.1)
SODIUM: 133 mmol/L — AB (ref 135–145)
SODIUM: 135 mmol/L (ref 135–145)
SODIUM: 139 mmol/L (ref 135–145)

## 2015-05-16 LAB — GLUCOSE, CAPILLARY
GLUCOSE-CAPILLARY: 354 mg/dL — AB (ref 65–99)
Glucose-Capillary: 197 mg/dL — ABNORMAL HIGH (ref 65–99)
Glucose-Capillary: 204 mg/dL — ABNORMAL HIGH (ref 65–99)
Glucose-Capillary: 320 mg/dL — ABNORMAL HIGH (ref 65–99)
Glucose-Capillary: 215 mg/dL — ABNORMAL HIGH (ref 65–99)

## 2015-05-16 LAB — LIPID PANEL
CHOL/HDL RATIO: 2.3 ratio
CHOLESTEROL: 115 mg/dL (ref 0–200)
HDL: 49 mg/dL (ref >40)
LDL Cholesterol: 44 mg/dL (ref 0–99)
TRIGLYCERIDES: 108 mg/dL (ref <150)
VLDL: 22 mg/dL (ref 0–40)

## 2015-05-16 MED ORDER — INSULIN ASPART 100 UNIT/ML ~~LOC~~ SOLN
0.0000 [IU] | Freq: Every day | SUBCUTANEOUS | Status: DC
  Administered 2015-05-16: 2 [IU] via SUBCUTANEOUS

## 2015-05-16 MED ORDER — INSULIN DETEMIR 100 UNIT/ML ~~LOC~~ SOLN
10.0000 [IU] | Freq: Every day | SUBCUTANEOUS | Status: DC
  Administered 2015-05-16: 10 [IU] via SUBCUTANEOUS
  Filled 2015-05-16 (×2): qty 0.1

## 2015-05-16 MED ORDER — LIVING WELL WITH DIABETES BOOK
Freq: Once | Status: AC
  Administered 2015-05-16: 12:00:00
  Filled 2015-05-16: qty 1

## 2015-05-16 MED ORDER — INSULIN STARTER KIT- SYRINGES (ENGLISH)
1.0000 | Freq: Once | Status: AC
Start: 2015-05-16 — End: 2015-05-16
  Administered 2015-05-16: 1
  Filled 2015-05-16: qty 1

## 2015-05-16 MED ORDER — ENOXAPARIN SODIUM 40 MG/0.4ML ~~LOC~~ SOLN
40.0000 mg | SUBCUTANEOUS | Status: DC
  Administered 2015-05-16 – 2015-05-17 (×2): 40 mg via SUBCUTANEOUS
  Filled 2015-05-16 (×2): qty 0.4

## 2015-05-16 MED ORDER — INSULIN ASPART 100 UNIT/ML ~~LOC~~ SOLN
0.0000 [IU] | Freq: Three times a day (TID) | SUBCUTANEOUS | Status: DC
  Administered 2015-05-16: 9 [IU] via SUBCUTANEOUS
  Administered 2015-05-16: 7 [IU] via SUBCUTANEOUS
  Administered 2015-05-17: 3 [IU] via SUBCUTANEOUS

## 2015-05-16 NOTE — Care Management Note (Signed)
Case Management Note  Patient Details  Name: Mark Owen MRN: [id number] Date of Birth: Nov 24, 1950  Subjective/Objective: 65 y/o m admitted w/DM. From home.                   Action/Plan:d/c plan home.   Expected Discharge Date:                  Expected Discharge Plan:  Home/Self Care  In-House Referral:     Discharge planning Services  CM Consult  Post Acute Care Choice:    Choice offered to:     DME Arranged:    DME Agency:     HH Arranged:    HH Agency:     Status of Service:  In process, will continue to follow  Medicare Important Message Given:    Date Medicare IM Given:    Medicare IM give by:    Date Additional Medicare IM Given:    Additional Medicare Important Message give by:     If discussed at [place] of Stay Meetings, dates discussed:    Additional Comments:  Dessa Phi, RN 05/16/2015, 12:38 PM

## 2015-05-16 NOTE — Progress Notes (Signed)
Pt watched diabetes videos 501-504. Teaching given on administering insulin. Pt self administered Novolog at 1649.

## 2015-05-16 NOTE — Progress Notes (Signed)
Inpatient Diabetes Program Recommendations  AACE/ADA: New Consensus Statement on Inpatient Glycemic Control (2015)  Target Ranges:  Prepandial:   less than 140 mg/dL      Peak postprandial:   less than 180 mg/dL (1-2 hours)      Critically ill patients:  140 - 180 mg/dL   Results for Mark Owen, Mark Owen (MRN [id number]) as of 05/16/2015 08:56  Ref. Range 05/15/2015 18:00 05/15/2015 22:56 05/15/2015 23:24 05/16/2015 04:02 05/16/2015 07:31  Glucose-Capillary Latest Ref Range: 65-99 mg/dL >600 (HH) 296 (H) 324 (H) 197 (H) 204 (H)    Admit with: Hyperglycemia/ New diagnosis of DM  History: HTN, Colon Cancer  Current Insulin Orders: Novolog Sensitive SSI TID AC + HS      Levemir 10 units daily     -Note patient has strong family history of DM.  -Current A1c pending.  -Spoke with pt about new diagnosis.  Explained what an A1C is, basic pathophysiology of DM Type 2, basic home care, basic diabetes diet nutrition principles, importance of checking CBGs and maintaining good CBG control to prevent long-term and short-term complications.  Reviewed signs and symptoms of hyperglycemia and hypoglycemia and how to treat hypoglycemia/hyperglycemia at home.  Also reviewed blood sugar goals at home.    -RNs to provide ongoing basic DM education at bedside with this patient.  Have ordered educational booklet, insulin starter kit, and DM videos.  Have also placed RD consult for DM diet education for this patient.  -Discussed with patient that his A1c results will likely help [place] to determine what kind of medications he will need at time of d/c.  Have asked RNs to allow pt to practice fingerstick glucose levels and also to practice giving insulin just in case decision made to send pt home on insulin.     --Will follow patient during hospitalization--  Wyn Quaker RN, MSN, CDE Diabetes Coordinator Inpatient Glycemic Control Team Team Pager: [phone] (8a-5p)

## 2015-05-16 NOTE — Progress Notes (Signed)
NUTRITION NOTE  RD consulted for nutrition education regarding diabetes.   Lab Results  Component Value Date   HGBA1C 5.9* 08/05/2014    RD provided "Carbohydrate Counting for People with Diabetes" handout from the Academy of Nutrition and Dietetics. Discussed different food groups and their effects on blood sugar, emphasizing carbohydrate-containing foods. Provided list of carbohydrates and recommended serving sizes of common foods.  Discussed importance of controlled and consistent carbohydrate intake throughout the day. Provided examples of ways to balance meals/snacks and encouraged intake of high-fiber, whole grain complex carbohydrates. Teach back method used.  Pt seen for DM education. Pt has been reading provided booklet and watching videos since admission and is knowledgeable about carbohydrates and their function in the body. He states that for the past 3 weeks he has been experiencing taste alteration and having increased thirst and urination. He states that these symptoms have improved since hospitalization.   Expect good compliance.  Body mass index is 22.87 kg/(m^2). Pt meets criteria for overweight based on current BMI.  Current diet order is Carb Modified, patient is consuming approximately 100% of meals at this time. Labs and medications reviewed. No further nutrition interventions warranted at this time. RD contact information provided. If additional nutrition issues arise, please re-consult RD.     Jarome Matin, RD, LDN Inpatient Clinical Dietitian Pager # [phone] After hours/weekend pager # [phone]

## 2015-05-16 NOTE — Progress Notes (Signed)
ANTICOAGULATION CONSULT NOTE - Initial Consult  Pharmacy Consult for lovenox Indication: VTE prophylaxis  Allergies  Allergen Reactions  . Penicillins     Black out Has patient had a PCN reaction causing immediate rash, facial/tongue/throat swelling, SOB or lightheadedness with hypotension: no Has patient had a PCN reaction causing severe rash involving mucus membranes or skin necrosis: No Has patient had a PCN reaction that required hospitalization Unknown Has patient had a PCN reaction occurring within the last 10 years: NO If all of the above answers are "NO", then may proceed with Cephalosporin use.     Patient Measurements: Height: 6\' 1"  (185.4 cm) Weight: 173 lb 4.8 oz (78.608 kg) IBW/kg (Calculated) : 79.9  Vital Signs: Temp: 97.9 F (36.6 C) (02/10 1404) Temp Source: Axillary (02/10 1404) BP: 127/87 mmHg (02/10 1404) Pulse Rate: 100 (02/10 1404)  Labs:  Recent Labs  05/15/15 1917  05/15/15 2354 05/16/15 0243 05/16/15 0247 05/16/15 0819  HGB 16.4  --   --   --  14.3  --   HCT 47.7  --   --   --  43.2  --   PLT 248  --   --   --  217  --   CREATININE  --   < > 1.74* 1.62*  --  1.37*  < > = values in this interval not displayed.  Estimated Creatinine Clearance: 61.4 mL/min (by C-G formula based on Cr of 1.37).   Medical History: Past Medical History  Diagnosis Date  . Hypertension   . Complication of anesthesia     when had knee arthroscopy - had hard time waking up and breathing problems-would not take deep breaths to wake up  . Anemia   . Cancer [facility])     colon tumor with cancer cells   Assessment: Patient's a 65 y.o M with hx colon cancer and s/p colectomy who was referred to the [facility] ED by his PCP on 2/9 for hyperglycemia. Patient's currently on heparin SQ q8h--To change to lovenox for VTE prophylaxis.  - cbc ok, no bleeding documented, scr elevated on admission but trending down (crcl~60)  Plan:  - Lovenox 40mg  SQ q24h - Pharmacy will sign off.   Re-consult [place] if scr declines with crcl<30 or platelets drop to less than 50% from baseline or <100K  Thank you for asking pharmacy to participate in this patient's care.  Yvone Slape P 05/16/2015,3:23 PM

## 2015-05-16 NOTE — Progress Notes (Signed)
[facility] PROGRESS NOTE  Mark Owen [id number] DOB: Jan 02, 1951 DOA: 05/15/2015 PCP: Henrine Screws, MD  Brief Summary  The patient is a 65 year old male with history of hypertension, anemia, stage II colon cancer status post right colectomy who presented with polyuria, polydipsia, bitter taste in his throat, blurry vision, nausea and vomiting. He had a history of borderline diabetes but had not been diagnosed with diabetes mellitus by his primary care doctor. At his primary care doctor's office, they did blood work and his blood glucose was 710. He was referred to the emergency department.  Assessment/Plan  Type 2 diabetes mellitus with hyperosmolarity without nonketotic hyperglycemic-hyperosmolar coma (nkhhc): Acute. Patient with no previous history diabetes with blood glucose of 810 on admission, normal anion gap, and negative for ketones on UA.  - Telemetry: Normal sinus rhythm, okay to discontinue telemetry - Changed to a diabetic diet - Allow IV fluids to expire - Start levo mere 10 units daily -  Continue low-dose sliding scale insulin and add daily at bedtime insulin - Diabetic education - Follow-up hemoglobin A1c and lipid panel   Hyperkalemia: Acute potassium 6.2 on admission. Patient was given IV fluids, albuterol treatment, and 10 units of insulin, Resolved  Acute kidney injury, resolved with IV fluids.  Discontinue renal ultrasound  Sinus congestion: Patient has been using Sudafed off and on. Suspect that he possibly could be having rebound congestion.  Diet:  Diabetic Access:  PIV IVF:  Off Proph:  Lovenox  Code Status: Full code Family Communication: Patient and his wife Disposition Plan: Likely home tomorrow pending education on blood sugars, insulin injection   Consultants:  Diabetic educator  Procedures:  None  Antibiotics:  None   HPI/Subjective:  Patient states that his blurry vision has improved. His nausea is less and he  is overall feeling more energetic. He continues to have a bitter taste in his mouth but is less thirsty and not going to the bathroom to void as often.    Objective: Filed Vitals:   05/15/15 2317 05/16/15 0405 05/16/15 1200 05/16/15 1404  BP: 122/80 119/77 137/89 127/87  Pulse: 96 80 86 100  Temp: 97.7 F (36.5 C) 97.5 F (36.4 C) 97.7 F (36.5 C) 97.9 F (36.6 C)  TempSrc: Oral Oral Oral Axillary  Resp: '12 12 13 12  '$ Height: '6\' 1"'$  (1.854 m)     Weight: 78.608 kg (173 lb 4.8 oz)     SpO2: 97% 100% 100% 100%    Intake/Output Summary (Last 24 hours) at 05/16/15 1513 Last data filed at 05/16/15 0900  Gross per 24 hour  Intake   1335 ml  Output    325 ml  Net   1010 ml   Filed Weights   05/15/15 2317  Weight: 78.608 kg (173 lb 4.8 oz)   Body mass index is 22.87 kg/(m^2).  Exam:   General:  Adult male, No acute distress  HEENT:  NCAT, MMM, no evidence of thrush  Cardiovascular:  RRR, nl S1, S2 no mrg, 2+ pulses, warm extremities  Respiratory:  CTAB, no increased WOB  Abdomen:   NABS, soft, NT/ND  MSK:   Normal tone and bulk, no LEE  Neuro:  Grossly intact  Data Reviewed: Basic Metabolic Panel:  Recent Labs Lab 05/15/15 2024 05/15/15 2354 05/16/15 0243 05/16/15 0819  NA 123* 135 139 133*  K 6.2* 4.6 4.7 4.7  CL 89* 100* 104 101  CO2 21* '23 25 23  '$ GLUCOSE 810* 340* 261* 388*  BUN 37*  33* 29* 27*  CREATININE 1.98* 1.74* 1.62* 1.37*  CALCIUM 9.6 9.6 9.5 9.0   Liver Function Tests: No results for input(s): AST, ALT, ALKPHOS, BILITOT, PROT, ALBUMIN in the last 168 hours. No results for input(s): LIPASE, AMYLASE in the last 168 hours. No results for input(s): AMMONIA in the last 168 hours. CBC:  Recent Labs Lab 05/15/15 1917 05/16/15 0247  WBC 9.0 8.2  HGB 16.4 14.3  HCT 47.7 43.2  MCV 77.6* 78.3  PLT 248 217    No results found for this or any previous visit (from the past 240 hour(s)).   Studies: No results found.  Scheduled Meds: .  cholecalciferol  1,000 Units Oral Daily  . ferrous gluconate  325 mg Oral Daily  . heparin  5,000 Units Subcutaneous 3 times per day  . insulin aspart  0-5 Units Subcutaneous QHS  . insulin aspart  0-9 Units Subcutaneous TID WC  . insulin detemir  10 Units Subcutaneous Daily  . insulin starter kit- syringes  1 kit Other Once  . loratadine  10 mg Oral Daily  . sodium chloride flush  3 mL Intravenous Q12H   Continuous Infusions:   Principal Problem:   Type 2 diabetes mellitus with hyperosmolarity without nonketotic hyperglycemic-hyperosmolar coma (nkhhc) (HCC) Active Problems:   Hyperkalemia   Acute kidney injury superimposed on chronic kidney disease ([place])    Time spent: 30 min    Mark Owen, [facility] Pager [phone]. If 7PM-7AM, please contact night-coverage at [email or web address], password [facility] 05/16/2015, 3:13 PM  LOS: 1 day

## 2015-05-17 DIAGNOSIS — E1100 Type 2 diabetes mellitus with hyperosmolarity without nonketotic hyperglycemic-hyperosmolar coma (NKHHC): Secondary | ICD-10-CM | POA: Diagnosis not present

## 2015-05-17 DIAGNOSIS — N179 Acute kidney failure, unspecified: Secondary | ICD-10-CM | POA: Diagnosis not present

## 2015-05-17 DIAGNOSIS — N189 Chronic kidney disease, unspecified: Secondary | ICD-10-CM

## 2015-05-17 DIAGNOSIS — E875 Hyperkalemia: Secondary | ICD-10-CM | POA: Diagnosis not present

## 2015-05-17 LAB — HEMOGLOBIN A1C
Hgb A1c MFr Bld: 15.9 % — ABNORMAL HIGH (ref 4.8–5.6)
Mean Plasma Glucose: 410 mg/dL

## 2015-05-17 LAB — GLUCOSE, CAPILLARY
GLUCOSE-CAPILLARY: 94 mg/dL (ref 65–99)
GLUCOSE-CAPILLARY: 218 mg/dL — AB (ref 65–99)
GLUCOSE-CAPILLARY: 283 mg/dL — AB (ref 65–99)
Glucose-Capillary: 224 mg/dL — ABNORMAL HIGH (ref 65–99)
Glucose-Capillary: 127 mg/dL — ABNORMAL HIGH (ref 65–99)
Glucose-Capillary: 172 mg/dL — ABNORMAL HIGH (ref 65–99)

## 2015-05-17 MED ORDER — INSULIN DETEMIR 100 UNIT/ML ~~LOC~~ SOLN
15.0000 [IU] | Freq: Every day | SUBCUTANEOUS | Status: DC
  Administered 2015-05-17 – 2015-05-18 (×2): 15 [IU] via SUBCUTANEOUS
  Filled 2015-05-17 (×2): qty 0.15

## 2015-05-17 MED ORDER — INSULIN ASPART 100 UNIT/ML ~~LOC~~ SOLN
8.0000 [IU] | Freq: Three times a day (TID) | SUBCUTANEOUS | Status: DC
  Administered 2015-05-17 – 2015-05-18 (×3): 8 [IU] via SUBCUTANEOUS

## 2015-05-17 MED ORDER — INSULIN ASPART 100 UNIT/ML ~~LOC~~ SOLN: 10.0000 [IU] | Freq: Three times a day (TID) | SUBCUTANEOUS | Status: DC

## 2015-05-17 MED ORDER — INSULIN ASPART 100 UNIT/ML ~~LOC~~ SOLN
5.0000 [IU] | Freq: Three times a day (TID) | SUBCUTANEOUS | Status: DC
  Administered 2015-05-17 (×2): 5 [IU] via SUBCUTANEOUS

## 2015-05-17 MED ORDER — INSULIN ASPART 100 UNIT/ML ~~LOC~~ SOLN
5.0000 [IU] | Freq: Once | SUBCUTANEOUS | Status: AC
  Administered 2015-05-17: 5 [IU] via SUBCUTANEOUS

## 2015-05-17 MED ORDER — INSULIN DETEMIR 100 UNIT/ML ~~LOC~~ SOLN: 20.0000 [IU] | Freq: Every day | SUBCUTANEOUS | Status: DC

## 2015-05-17 MED ORDER — INSULIN ASPART 100 UNIT/ML ~~LOC~~ SOLN: 8.0000 [IU] | Freq: Three times a day (TID) | SUBCUTANEOUS | Status: DC

## 2015-05-17 NOTE — Progress Notes (Signed)
Patient drew up and self administered insulin at 2149.

## 2015-05-17 NOTE — Progress Notes (Signed)
Pt drew up and self administered insulin at 0737, and self administered at 1024.  Rosine Beat, RN

## 2015-05-17 NOTE — Progress Notes (Signed)
[facility] PROGRESS NOTE  Mark Owen [id number] DOB: 1950/07/14 DOA: 05/15/2015 PCP: Mark Screws, MD  Brief Summary  The patient is a 65 year old male with history of hypertension, anemia, stage II colon cancer status post right colectomy who presented with polyuria, polydipsia, bitter taste in his throat, blurry vision, nausea and vomiting. He had a history of borderline diabetes but had not been diagnosed with diabetes mellitus by his primary care doctor. At his primary care doctor's office, they did blood work and his blood glucose was 710. He was referred to the emergency department.  Assessment/Plan  Type 2 diabetes mellitus with hyperosmolarity without nonketotic hyperglycemic-hyperosmolar coma (nkhhc): Acute. Patient with no previous history diabetes with blood glucose of 810 on admission, normal anion gap, and negative for ketones on UA. Spent 10 minutes discussing diabetes management, particularly CBG checks and insulin administration and hypoglycemia with teach back method.  Patient still not grasping important concepts readily.  Will need as simple a regimen as possible for home, so will not anticipate using SSI and will plan to spend the rest of today with additional teaching so he feels comfortable recognizing and treating hypoglycemia, administering insulin, etc. -  Insulin doses not accurately recorded yesterday so having difficulty calculating insulin for today -  Increase to levemir 15 units daily -  Standing aspart 5 units with meals >> increase to 10 units this evening  -  Diabetic education -  A1c 15.9  Hyperkalemia: Acute potassium 6.2 on admission. Patient was given IV fluids, albuterol treatment, and 10 units of insulin, Resolved  Acute kidney injury, resolved with IV fluids.  Discontinue renal ultrasound  Sinus congestion: Patient has been using Sudafed off and on. Suspect that he possibly could be having rebound congestion.  Diet:   Diabetic Access:  PIV IVF:  Off Proph:  Lovenox  Code Status: Full code Family Communication: Patient and his wife Disposition Plan:   Needs to better understand insulin management, hypoglycemia before discharge.  Currently still has limited understanding of these topics.     Consultants:  Diabetic educator  Procedures:  None  Antibiotics:  None   HPI/Subjective:  Blurry vision is better.  Scratchy throat and poor taste of food has improved.   Objective: Filed Vitals:   05/16/15 1404 05/16/15 2100 05/17/15 0552 05/17/15 0900  BP: 127/87 138/86 130/78 127/83  Pulse: 100 90 75 84  Temp: 97.9 F (36.6 C) 98 F (36.7 C) 97.7 F (36.5 C) 98.3 F (36.8 C)  TempSrc: Axillary Oral Oral Oral  Resp: 12 16 16 18   Height:      Weight:      SpO2: 100% 99% 100% 100%    Intake/Output Summary (Last 24 hours) at 05/17/15 1321 Last data filed at 05/17/15 0900  Gross per 24 hour  Intake    620 ml  Output      1 ml  Net    619 ml   Filed Weights   05/15/15 2317  Weight: 78.608 kg (173 lb 4.8 oz)   Body mass index is 22.87 kg/(m^2).  Exam:   General:  Adult male, No acute distress  HEENT:  NCAT, MMM, no evidence of thrush  Cardiovascular:  RRR, nl S1, S2 no mrg, 2+ pulses, warm extremities  Respiratory:  CTAB, no increased WOB  Abdomen:   NABS, soft, NT/ND  MSK:   Normal tone and bulk, no LEE  Neuro:  Grossly intact  Data Reviewed: Basic Metabolic Panel:  Recent Labs Lab 05/15/15 2024 05/15/15  2354 05/16/15 0243 05/16/15 0819  NA 123* 135 139 133*  K 6.2* 4.6 4.7 4.7  CL 89* 100* 104 101  CO2 21* 23 25 23   GLUCOSE 810* 340* 261* 388*  BUN 37* 33* 29* 27*  CREATININE 1.98* 1.74* 1.62* 1.37*  CALCIUM 9.6 9.6 9.5 9.0   Liver Function Tests: No results for input(s): AST, ALT, ALKPHOS, BILITOT, PROT, ALBUMIN in the last 168 hours. No results for input(s): LIPASE, AMYLASE in the last 168 hours. No results for input(s): AMMONIA in the last 168  hours. CBC:  Recent Labs Lab 05/15/15 1917 05/16/15 0247  WBC 9.0 8.2  HGB 16.4 14.3  HCT 47.7 43.2  MCV 77.6* 78.3  PLT 248 217    No results found for this or any previous visit (from the past 240 hour(s)).   Studies: No results found.  Scheduled Meds: . cholecalciferol  1,000 Units Oral Daily  . enoxaparin (LOVENOX) injection  40 mg Subcutaneous Q24H  . ferrous gluconate  325 mg Oral Daily  . insulin aspart  5 Units Subcutaneous TID WC  . insulin detemir  15 Units Subcutaneous Daily  . loratadine  10 mg Oral Daily  . sodium chloride flush  3 mL Intravenous Q12H   Continuous Infusions:   Principal Problem:   Type 2 diabetes mellitus with hyperosmolarity without nonketotic hyperglycemic-hyperosmolar coma (nkhhc) (HCC) Active Problems:   Hyperkalemia   Acute kidney injury superimposed on chronic kidney disease ([place])    Time spent: 30 min    Mark Owen, [facility] Pager [phone]. If 7PM-7AM, please contact night-coverage at [email or web address], password [facility] 05/17/2015, 1:21 PM  LOS: 2 days

## 2015-05-17 NOTE — Discharge Summary (Signed)
Physician Discharge Summary  BERTHOLD GLACE QDI:[id number] DOB: 1950-09-26 DOA: 05/15/2015  PCP: Henrine Screws, MD  Admit date: 05/15/2015 Discharge date: 05/18/2015  Recommendations for Outpatient Follow-up:  1. F/u with Dr. Inda Merlin in 2 weeks.  Advised to bring blood sugars with him to follow up visit.  Needs routine diabetes management 2. Levemir 15 units and aspart 8 units before meals 3. Resume blood pressure medication if creatinine stable  Discharge Diagnoses:  Principal Problem:   Type 2 diabetes mellitus with hyperosmolarity without nonketotic hyperglycemic-hyperosmolar coma (nkhhc) (HCC) Active Problems:   Hyperkalemia   AKI (acute kidney injury) ([place])   Essential hypertension   Sinus congestion   Discharge Condition: stable, improved  Diet recommendation: diabetic diet  Wt Readings from Last 3 Encounters:  05/15/15 78.608 kg (173 lb 4.8 oz)  08/23/14 79.606 kg (175 lb 8 oz)  08/08/14 84.482 kg (186 lb 4 oz)    History of present illness:  The patient is a 65 year old male with history of hypertension, anemia, stage II colon cancer status post right colectomy who presented with polyuria, polydipsia, bitter taste in his throat, blurry vision, nausea and vomiting. He had a history of borderline diabetes but had not been diagnosed with diabetes mellitus by his primary care doctor. At his primary care doctor's office, they did blood work and his blood glucose was 710. He was referred to the emergency department.  Hospital Course:   Type 2 diabetes mellitus with hyperosmolarity without nonketotic hyperglycemic-hyperosmolar coma (nkhhc): Acute. Patient with no previous history diabetes with blood glucose of 810 on admission, normal anion gap, and negative for ketones on UA. He was initially started on an insulin infusion and IV fluids. He was gradually transitioned to subcutaneous insulin. His hemoglobin A1c was 15.9, and he was instructed to use long-acting insulin 15  units once daily with aspart 8 units prior to meals if his pre-meal blood sugar is greater than 120.  He received education by myself, nursing staff, and by the diabetic coordinator. He also met with the nutritionist. He was able to explain what to do with his insulin if his blood sugar is less than 70, less than 120. He was correctly able to demonstrate how to check a CBG and administer insulin. He was advised to keep a log of all of his blood sugars and bring this with him to his follow-up appointment with his primary care doctor in approximately 1-2 weeks. He already has an appointment scheduled.  He should call his primary care doctor's office if he has any questions regarding his blood sugars or his insulin.  Hypertension, I held his lisinopril-hydrochlorothiazide for now due to his recent acute kidney injury. His primary care doctor may resume this at his next visit if his kidney function remained stable.  Hyperkalemia: Potassium 6.2 on admission. Patient was given IV fluids, albuterol treatment, and 10 units of insulin and his potassium trended down.  Acute kidney injury, resolved with IV fluids.  Held his ACE inhibitor and diuretic for now. Also advised him not to use NSAIDs until he follows up with his primary care doctor.  Sinus congestion: Patient has been using Sudafed off and on. Suspect that he may have had some rebound congestion.   Consultants:  Diabetic educator  Procedures:  None  Antibiotics:  None   Discharge Exam: Filed Vitals:   05/17/15 2123 05/18/15 0554  BP: 155/86 139/89  Pulse: 92 77  Temp: 98.2 F (36.8 C) 97.7 F (36.5 C)  Resp: 18 18  Filed Vitals:   05/17/15 0900 05/17/15 1359 05/17/15 2123 05/18/15 0554  BP: 127/83 143/96 155/86 139/89  Pulse: 84 93 92 77  Temp: 98.3 F (36.8 C) 97.4 F (36.3 C) 98.2 F (36.8 C) 97.7 F (36.5 C)  TempSrc: Oral Oral Oral Oral  Resp: _0 Height:      Weight:      SpO2: 100% 100% 100% 100%      General: Adult male, No acute distress  HEENT: no evidence of thrush  Cardiovascular: RRR, nl S1, S2 no mrg, 2+ pulses, warm extremities  Respiratory: CTAB, no increased WOB  MSK: Normal tone and bulk, no LEE  Neuro: Grossly intact  Discharge Instructions      Discharge Instructions    Ambulatory referral to Nutrition and Diabetic Education    Complete by:  As directed   New diagnosis of DM.  A1c pending.  PCP: Dr. Josetta Huddle.  Please wait to contact patient until after discharge.  Pt would like to follow up with his PCP before coming to DM education classes.  Patient: Please call the [facility] after discharge to schedule an appointment for diabetes education if you do not hear from the center after discharge  [phone]     Call MD for:  difficulty breathing, headache or visual disturbances    Complete by:  As directed      Call MD for:  extreme fatigue    Complete by:  As directed      Call MD for:  hives    Complete by:  As directed      Call MD for:  persistant dizziness or light-headedness    Complete by:  As directed      Call MD for:  persistant nausea and vomiting    Complete by:  As directed      Call MD for:  severe uncontrolled pain    Complete by:  As directed      Call MD for:  temperature >100.4    Complete by:  As directed      Diet Carb Modified    Complete by:  As directed      Discharge instructions    Complete by:  As directed   Check your blood sugar four times per day:  Before breakfast, lunch, dinner, and bed.  If your blood sugar is less than 120, do not give yourself the mealtime insulin.  If your blood sugar is 120 or higher, give yourself 8 units of humalog lispro insulin before your meal.  Do NOT give yourself insulin before bed.  If your pre-breakfast blood sugar is greater than 70, it is still safe to give yourself your long acting levemir insulin.  For any blood sugar 70 or less, drink 4  oz of juice and recheck your blood sugar in 15 minutes.  If it remains low, keep repeating this process until your blood sugar is > 100.  Do NOT give yourself insulin afterwards if you have a low blood sugar.  Once your blood sugar is > 100, call your primary care doctor's office for instructions.  Please record all of your blood sugars in a notebook and bring this with you to your follow up appointment with Dr. Inda Merlin in two weeks.     Increase activity slowly    Complete by:  As directed             Medication List  STOP taking these medications        ibuprofen 200 MG tablet  Commonly known as:  ADVIL,MOTRIN     lisinopril-hydrochlorothiazide 10-12.5 MG tablet  Commonly known as:  PRINZIDE,ZESTORETIC     pseudoephedrine 120 MG 12 hr tablet  Commonly known as:  SUDAFED     pseudoephedrine 30 MG tablet  Commonly known as:  SUDAFED      TAKE these medications        anti-nausea solution  Take 10 mLs by mouth every 15 (fifteen) minutes as needed for nausea or vomiting.     blood glucose meter kit and supplies  Dispense based on patient and insurance preference. Use up to four times daily as directed. (FOR ICD-9 250.00, 250.01).     diphenhydrAMINE 25 MG tablet  Commonly known as:  BENADRYL  Take 25 mg by mouth daily as needed for allergies.     ferrous gluconate 325 MG tablet  Commonly known as:  FERGON  Take 325 mg by mouth daily.     fexofenadine 180 MG tablet  Commonly known as:  ALLEGRA  Take 180 mg by mouth daily as needed for allergies or rhinitis.     glucose 4 GM chewable tablet  Chew 1 tablet (4 g total) by mouth as needed for low blood sugar.     Insulin Detemir 100 UNIT/ML Pen  Commonly known as:  LEVEMIR  Inject 15 Units into the skin daily.     insulin lispro 100 UNIT/ML KiwkPen  Commonly known as:  HUMALOG KWIKPEN  Inject 0.08 mLs (8 Units total) into the skin 3 (three) times daily. Do not administer if blood sugar is less than 120.  Eat after  taking this insulin.     Insulin Pen Needle 31G X 6 MM Misc  Use as directed four times per day     metFORMIN 500 MG tablet  Commonly known as:  GLUCOPHAGE  Take 1 tablet (500 mg total) by mouth daily with breakfast.     multivitamin with minerals Tabs tablet  Take 1 tablet by mouth every morning.     SAW PALMETTO PO  Take 1 tablet by mouth daily.     VISINE OP  Apply 1-52 drops to eye daily as needed (dry eyes.).     VITAMIN D3 PO  Take 1 tablet by mouth daily.       Follow-up Information    Follow up with GATES,ROBERT NEVILL, MD. Go in 2 weeks.   Specialty:  Internal Medicine   Contact information:   [address] [place] [address] [phone]        The results of significant diagnostics from this hospitalization (including imaging, microbiology, ancillary and laboratory) are listed below for reference.    Significant Diagnostic Studies: No results found.  Microbiology: No results found for this or any previous visit (from the past 240 hour(s)).   Labs: Basic Metabolic Panel:  Recent Labs Lab 05/15/15 2024 05/15/15 2354 05/16/15 0243 05/16/15 0819  NA 123* 135 139 133*  K 6.2* 4.6 4.7 4.7  CL 89* 100* 104 101  CO2 21* _0 GLUCOSE 810* 340* 261* 388*  BUN 37* 33* 29* 27*  CREATININE 1.98* 1.74* 1.62* 1.37*  CALCIUM 9.6 9.6 9.5 9.0   Liver Function Tests: No results for input(s): AST, ALT, ALKPHOS, BILITOT, PROT, ALBUMIN in the last 168 hours. No results for input(s): LIPASE, AMYLASE in the last 168 hours. No results for input(s): AMMONIA  in the last 168 hours. CBC:  Recent Labs Lab 05/15/15 1917 05/16/15 0247  WBC 9.0 8.2  HGB 16.4 14.3  HCT 47.7 43.2  MCV 77.6* 78.3  PLT 248 217   Cardiac Enzymes: No results for input(s): CKTOTAL, CKMB, CKMBINDEX, TROPONINI in the last 168 hours. BNP: BNP (last 3 results) No results for input(s): BNP in the last 8760 hours.  ProBNP (last 3 results) No results for  input(s): PROBNP in the last 8760 hours.  CBG:  Recent Labs Lab 05/17/15 1129 05/17/15 1636 05/17/15 2127 05/18/15 0719 05/18/15 1108  GLUCAP 283* 127* 172* 195* 225*    Time coordinating discharge: 35 minutes  Signed:  Nicky Kras  [facility] 05/18/2015, 11:55 AM

## 2015-05-18 DIAGNOSIS — I10 Essential (primary) hypertension: Secondary | ICD-10-CM

## 2015-05-18 DIAGNOSIS — R0981 Nasal congestion: Secondary | ICD-10-CM

## 2015-05-18 DIAGNOSIS — E875 Hyperkalemia: Secondary | ICD-10-CM | POA: Diagnosis not present

## 2015-05-18 DIAGNOSIS — N179 Acute kidney failure, unspecified: Secondary | ICD-10-CM | POA: Diagnosis not present

## 2015-05-18 DIAGNOSIS — E1100 Type 2 diabetes mellitus with hyperosmolarity without nonketotic hyperglycemic-hyperosmolar coma (NKHHC): Secondary | ICD-10-CM | POA: Diagnosis not present

## 2015-05-18 LAB — GLUCOSE, CAPILLARY
Glucose-Capillary: 195 mg/dL — ABNORMAL HIGH (ref 65–99)
Glucose-Capillary: 225 mg/dL — ABNORMAL HIGH (ref 65–99)

## 2015-05-18 MED ORDER — INSULIN PEN NEEDLE 31G X 6 MM MISC: Status: DC

## 2015-05-18 MED ORDER — INSULIN DETEMIR 100 UNIT/ML FLEXPEN: 15.0000 [IU] | PEN_INJECTOR | Freq: Every day | SUBCUTANEOUS | Status: DC

## 2015-05-18 MED ORDER — INSULIN LISPRO 100 UNIT/ML (KWIKPEN): 8.0000 [IU] | PEN_INJECTOR | Freq: Three times a day (TID) | SUBCUTANEOUS | Status: DC

## 2015-05-18 MED ORDER — BLOOD GLUCOSE METER KIT: PACK | Status: AC

## 2015-05-18 MED ORDER — GLUCOSE 4 G PO CHEW: 1.0000 | CHEWABLE_TABLET | ORAL | Status: AC | PRN

## 2015-05-18 MED ORDER — METFORMIN HCL 500 MG PO TABS: 500.0000 mg | ORAL_TABLET | Freq: Every day | ORAL | Status: DC

## 2015-05-18 NOTE — Care Management Note (Signed)
Case Management Note  Patient Details  Name: Mark Owen MRN: [id number] Date of Birth: March 07, 1951  Subjective/Objective:   Newly diagnosed diabetes                 Action/Plan: NCM spoke to pt and wife, Mark Owen # [phone]. Pt states he viewed all videos related to diabetes while in hospital. States he had previously started changing his diet to include more vegetables, healthy grains and decreasing his sugar intake when he was diagnosed with cancer in 2016. He states he is retired and tries to stay active. He walks for exercises. He feels confident he can administer his insulin at home. Refused [place] RN at this time. Explained the pharmacist at his retail pharmacy can go over with him on how to administer insulin pens. Provided pt with information and copay card for Levemir and Humalog Kwikpen. Viewed website on Levemir with pt and they had demonstration video he can watch at home, and other important information on diabetes. Pt signed up for their support program to receive information via email. Wife states they try to adhere to a healthy lifestyle. They have a garden at home.    Expected Discharge Date:  05/18/2015              Expected Discharge Plan:  Home/Self Care  In-House Referral:  NA  Discharge planning Services  CM Consult, Medication Assistance  Post Acute Care Choice:  Home Health Choice offered to:  Patient  DME Arranged:  N/A DME Agency:  NA  HH Arranged:  Patient Refused HH Agency:  NA  Status of Service:  Completed, signed off  Medicare Important Message Given:  Yes Date Medicare IM Given:    Medicare IM give by:    Date Additional Medicare IM Given:    Additional Medicare Important Message give by:     If discussed at [place] of Stay Meetings, dates discussed:    Additional Comments:  Erenest Rasher, RN 05/18/2015, 1:54 PM

## 2015-05-18 NOTE — Care Management Important Message (Signed)
Important Message  Patient Details  Name: Mark Owen MRN: [id number] Date of Birth: 08/23/1950   Medicare Important Message Given:  Yes    Erenest Rasher, RN 05/18/2015, 1:53 PM

## 2015-06-17 ENCOUNTER — Encounter: Payer: 59 | Attending: Internal Medicine

## 2015-06-17 VITALS — Ht 71.0 in | Wt 183.0 lb

## 2015-06-17 DIAGNOSIS — E1100 Type 2 diabetes mellitus with hyperosmolarity without nonketotic hyperglycemic-hyperosmolar coma (NKHHC): Secondary | ICD-10-CM | POA: Insufficient documentation

## 2015-06-17 DIAGNOSIS — E119 Type 2 diabetes mellitus without complications: Secondary | ICD-10-CM

## 2015-06-23 NOTE — Progress Notes (Signed)
Patient was seen on 06-17-15 for the first of a series of three diabetes self-management courses at [facility].  Patient Education Plan per assessed needs and concerns is to attend four course education program for Diabetes Self Management Education.  The following learning objectives were met by the patient during this class:  Describe diabetes  State some common risk factors for diabetes  Defines the role of glucose and insulin  Identifies type of diabetes and pathophysiology  Describe the relationship between diabetes and cardiovascular risk  State the members of the Healthcare Team  States the rationale for glucose monitoring  State when to test glucose  State their individual Target Range  State the importance of logging glucose readings  Describe how to interpret glucose readings  Identifies A1C target  Explain the correlation between A1c and eAG values  State symptoms and treatment of high blood glucose  State symptoms and treatment of low blood glucose  Explain proper technique for glucose testing  Identifies proper sharps disposal  Handouts given during class include:  Living Well with Diabetes book  Carb Counting and Meal Planning book  Meal Plan Card  Carbohydrate guide  Meal planning worksheet  Low Sodium Flavoring Tips  The diabetes portion plate  A1c to eAG Conversion Chart  Diabetes Medications  Diabetes Recommended Care Schedule  Support Group  Diabetes Success Plan  Core Class Satisfaction Survey  Follow-Up Plan:  Attend core 2    

## 2015-06-24 DIAGNOSIS — E119 Type 2 diabetes mellitus without complications: Secondary | ICD-10-CM

## 2015-06-24 DIAGNOSIS — E1100 Type 2 diabetes mellitus with hyperosmolarity without nonketotic hyperglycemic-hyperosmolar coma (NKHHC): Secondary | ICD-10-CM | POA: Diagnosis not present

## 2015-06-24 NOTE — Progress Notes (Signed)

## 2015-07-01 DIAGNOSIS — E1100 Type 2 diabetes mellitus with hyperosmolarity without nonketotic hyperglycemic-hyperosmolar coma (NKHHC): Secondary | ICD-10-CM | POA: Diagnosis not present

## 2015-07-01 DIAGNOSIS — E119 Type 2 diabetes mellitus without complications: Secondary | ICD-10-CM

## 2015-07-08 NOTE — Progress Notes (Signed)
Patient was seen on 07/01/15 for the third of a series of three diabetes self-management courses at the [facility].   Catalina Gravel the amount of activity recommended for healthy living . Describe activities suitable for individual needs . Identify ways to regularly incorporate activity into daily life . Identify barriers to activity and ways to over come these barriers  Identify diabetes medications being personally used and their primary action for lowering glucose and possible side effects . Describe role of stress on blood glucose and develop strategies to address psychosocial issues . Identify diabetes complications and ways to prevent them  Explain how to manage diabetes during illness . Evaluate success in meeting personal goal . Establish 2-3 goals that they will plan to diligently work on until they return for the  59-month follow-up visit  Goals:   I will count my carb choices at most meals and snacks  I will take my diabetes medications as scheduled  I will eat less unhealthy fats by eating less of those foods  I will test my glucose at least 2 times a day, 7 days a week  I will look at patterns in my record book at least 5 days a month  Your patient has identified these potential barriers to change:  "none"  Your patient has identified their diabetes self-care support plan as  Family Support American diabetes Association Web site Plan:  Attend Support Group as desired

## 2015-08-04 ENCOUNTER — Other Ambulatory Visit: Payer: Self-pay | Admitting: General Surgery

## 2015-08-04 DIAGNOSIS — Z85038 Personal history of other malignant neoplasm of large intestine: Secondary | ICD-10-CM

## 2015-08-07 ENCOUNTER — Other Ambulatory Visit: Payer: Self-pay | Admitting: Gastroenterology

## 2015-08-13 ENCOUNTER — Ambulatory Visit
Admission: RE | Admit: 2015-08-13 | Discharge: 2015-08-13 | Disposition: A | Discharge status: Home / Self Care | Payer: 59 | Source: Ambulatory Visit | Attending: General Surgery | Admitting: General Surgery

## 2015-08-13 DIAGNOSIS — Z85038 Personal history of other malignant neoplasm of large intestine: Secondary | ICD-10-CM

## 2015-08-13 MED ORDER — IOPAMIDOL (ISOVUE-300) INJECTION 61%
100.0000 mL | Freq: Once | INTRAVENOUS | Status: AC | PRN
  Administered 2015-08-13: 100 mL via INTRAVENOUS

## 2015-08-20 ENCOUNTER — Encounter (HOSPITAL_COMMUNITY): Payer: Self-pay | Admitting: *Deleted

## 2015-09-02 ENCOUNTER — Ambulatory Visit (HOSPITAL_COMMUNITY)
Admission: RE | Admit: 2015-09-02 | Discharge: 2015-09-02 | Disposition: A | Discharge status: Home / Self Care | Payer: 59 | Source: Ambulatory Visit | Attending: Gastroenterology | Admitting: Gastroenterology

## 2015-09-02 ENCOUNTER — Ambulatory Visit (HOSPITAL_COMMUNITY): Payer: 59 | Admitting: Anesthesiology

## 2015-09-02 ENCOUNTER — Encounter (HOSPITAL_COMMUNITY)
Admission: RE | Disposition: A | Discharge status: Home / Self Care | Payer: Self-pay | Source: Ambulatory Visit | Attending: Gastroenterology

## 2015-09-02 ENCOUNTER — Encounter (HOSPITAL_COMMUNITY): Payer: Self-pay | Admitting: Anesthesiology

## 2015-09-02 DIAGNOSIS — I10 Essential (primary) hypertension: Secondary | ICD-10-CM | POA: Insufficient documentation

## 2015-09-02 DIAGNOSIS — Z79899 Other long term (current) drug therapy: Secondary | ICD-10-CM | POA: Diagnosis not present

## 2015-09-02 DIAGNOSIS — E119 Type 2 diabetes mellitus without complications: Secondary | ICD-10-CM | POA: Insufficient documentation

## 2015-09-02 DIAGNOSIS — Z9049 Acquired absence of other specified parts of digestive tract: Secondary | ICD-10-CM | POA: Diagnosis not present

## 2015-09-02 DIAGNOSIS — Z1211 Encounter for screening for malignant neoplasm of colon: Secondary | ICD-10-CM | POA: Diagnosis not present

## 2015-09-02 DIAGNOSIS — Z85038 Personal history of other malignant neoplasm of large intestine: Secondary | ICD-10-CM | POA: Diagnosis not present

## 2015-09-02 HISTORY — PX: COLONOSCOPY WITH PROPOFOL: SHX5780

## 2015-09-02 LAB — GLUCOSE, CAPILLARY: GLUCOSE-CAPILLARY: 86 mg/dL (ref 65–99)

## 2015-09-02 SURGERY — COLONOSCOPY WITH PROPOFOL
Anesthesia: Monitor Anesthesia Care

## 2015-09-02 MED ORDER — PROPOFOL 10 MG/ML IV BOLUS
INTRAVENOUS | Status: AC
  Filled 2015-09-02: qty 40

## 2015-09-02 MED ORDER — ONDANSETRON HCL 4 MG/2ML IJ SOLN
INTRAMUSCULAR | Status: AC
  Filled 2015-09-02: qty 6

## 2015-09-02 MED ORDER — ONDANSETRON HCL 4 MG/2ML IJ SOLN
INTRAMUSCULAR | Status: DC | PRN
  Administered 2015-09-02: 4 mg via INTRAVENOUS

## 2015-09-02 MED ORDER — EPHEDRINE SULFATE 50 MG/ML IJ SOLN
INTRAMUSCULAR | Status: AC
  Filled 2015-09-02: qty 1

## 2015-09-02 MED ORDER — SODIUM CHLORIDE 0.9 % IV SOLN
INTRAVENOUS | Status: DC
Start: 2015-09-02 — End: 2015-09-02

## 2015-09-02 MED ORDER — PROPOFOL 10 MG/ML IV BOLUS
INTRAVENOUS | Status: DC | PRN
  Administered 2015-09-02: 30 mg via INTRAVENOUS
  Administered 2015-09-02: 50 mg via INTRAVENOUS
  Administered 2015-09-02 (×3): 30 mg via INTRAVENOUS
  Administered 2015-09-02: 50 mg via INTRAVENOUS
  Administered 2015-09-02 (×2): 30 mg via INTRAVENOUS

## 2015-09-02 MED ORDER — LIDOCAINE HCL (CARDIAC) 20 MG/ML IV SOLN: INTRAVENOUS | Status: DC | PRN

## 2015-09-02 MED ORDER — LACTATED RINGERS IV SOLN
INTRAVENOUS | Status: DC
  Administered 2015-09-02: 1000 mL via INTRAVENOUS
  Administered 2015-09-02: 12:00:00 via INTRAVENOUS

## 2015-09-02 SURGICAL SUPPLY — 22 items

## 2015-09-02 NOTE — Transfer of Care (Signed)
Immediate Anesthesia Transfer of Care Note  Patient: Mark Owen  Procedure(s) Performed: Procedure(s): COLONOSCOPY WITH PROPOFOL (N/A)  Patient Location: PACU and Endoscopy Unit  Anesthesia Type:MAC  Level of Consciousness: awake and alert   Airway & Oxygen Therapy: Patient Spontanous Breathing and Patient connected to face mask oxygen  Post-op Assessment: Report given to RN and Post -op Vital signs reviewed and stable  Post vital signs: Reviewed and stable  Last Vitals:  Filed Vitals:   09/02/15 1116  BP: 139/93  Pulse: 63  Temp: 36.5 C  Resp: 11    Last Pain: There were no vitals filed for this visit.       Complications: No apparent anesthesia complications

## 2015-09-02 NOTE — Op Note (Signed)
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 Providers:                Garlan Fair, MD, Laverta Baltimore, RN, Alfonso Patten, Technician, Glenis Smoker, CRNA Referring MD:              Medicines:                Propofol per Anesthesia Complications:            No immediate complications. Estimated Blood Loss:     Estimated blood loss: none. Procedure:                Pre-Anesthesia Assessment:                           - Prior to the procedure, a History and Physical                            was performed, and patient medications and                            allergies were reviewed. The patient's tolerance of                            previous anesthesia was also reviewed. The risks                            and benefits of the procedure and the sedation                            options and risks were discussed with the patient.                            All questions were answered, and informed consent                            was obtained. Prior Anticoagulants: The patient has  taken no previous anticoagulant or antiplatelet                            agents. ASA Grade Assessment: II - A patient with                            mild systemic disease. After reviewing the risks                            and benefits, the patient was deemed in                            satisfactory condition to undergo the procedure.         After obtaining informed consent, the colonoscope                            was passed under direct vision. Throughout the                            procedure, the patient's blood pressure, pulse, and                            oxygen saturations were monitored continuously. The                            [id number] scope was introduced through                            the anus and advanced to the the ileocolonic                            anastomosis. The colonoscopy was performed without                            difficulty. The patient tolerated the procedure                            well. The quality of the bowel preparation was                            good. The terminal ileum and the rectum were                            photographed. Scope In: 12:11:37 PM Scope Out: 12:26:37 PM Scope Withdrawal Time: 0 hours 7 minutes 51 seconds  Total Procedure Duration: 0 hours 15 minutes 0 seconds  Findings:      The perianal and digital rectal examinations were normal.      The entire examined colon appeared normal post robotic-assisted right       colectomy. Impression:               - The entire examined colon is normal.                           -  No specimens collected. Moderate Sedation:      N/A- Per Anesthesia Care Recommendation:           - Patient has a contact number available for                            emergencies. The signs and symptoms of potential                            delayed complications were discussed with the                            patient. Return to normal activities tomorrow.                            Written discharge instructions were provided to the                            patient.                           - Repeat colonoscopy in 3 years for surveillance.                           - Resume previous diet.                           - Continue present medications. Procedure Code(s):        --- Professional ---                            [phone], Colonoscopy, flexible; diagnostic, including                            collection of specimen(s) by brushing or washing,                            when performed (separate procedure) Diagnosis Code(s):        --- Professional ---                           [id number], Personal history of other malignant                            neoplasm of large intestine CPT copyright 2016 American Medical Association. All rights reserved. The codes documented in this report are preliminary and upon coder review may  be revised to meet current compliance requirements. Earle Gell, MD Garlan Fair, MD 09/02/2015 12:34:03 PM This report has been signed electronically. Number of Addenda: 0

## 2015-09-02 NOTE — Anesthesia Preprocedure Evaluation (Addendum)
Anesthesia Evaluation  Patient identified by MRN, date of birth, ID band Patient awake  General Assessment Comment:After knee arthroscopy, he had a hard time waking up and breathing problems.  Reviewed: Allergy & Precautions, NPO status , Patient's Chart, lab work & pertinent test results  History of Anesthesia Complications (+) history of anesthetic complications  Airway Mallampati: II  TM Distance: >3 FB Neck ROM: Full    Dental no notable dental hx. (+) Dental Advisory Given   Pulmonary neg pulmonary ROS,    Pulmonary exam normal breath sounds clear to auscultation       Cardiovascular hypertension, negative cardio ROS Normal cardiovascular exam Rhythm:Regular Rate:Normal     Neuro/Psych negative neurological ROS  negative psych ROS   GI/Hepatic negative GI ROS, Neg liver ROS,   Endo/Other  negative endocrine ROSdiabetes  Renal/GU negative Renal ROS  negative genitourinary   Musculoskeletal negative musculoskeletal ROS (+)   Abdominal   Peds negative pediatric ROS (+)  Hematology negative hematology ROS (+)   Anesthesia Other Findings   Reproductive/Obstetrics negative OB ROS                           Anesthesia Physical Anesthesia Plan  ASA: II  Anesthesia Plan: MAC   Post-op Pain Management:    Induction: Intravenous  Airway Management Planned: Natural Airway and Nasal Cannula  Additional Equipment:   Intra-op Plan:   Post-operative Plan: Extubation in OR  Informed Consent: I have reviewed the patients History and Physical, chart, labs and discussed the procedure including the risks, benefits and alternatives for the proposed anesthesia with the patient or authorized representative who has indicated his/her understanding and acceptance.   Dental advisory given  Plan Discussed with: CRNA  Anesthesia Plan Comments:         Anesthesia Quick Evaluation

## 2015-09-02 NOTE — Discharge Instructions (Signed)
Colonoscopy, Care After °Refer to this sheet in the next few weeks. These instructions provide you with information on caring for yourself after your procedure. Your health care provider may also give you more specific instructions. Your treatment has been planned according to current medical practices, but problems sometimes occur. Call your health care provider if you have any problems or questions after your procedure. °WHAT TO EXPECT AFTER THE PROCEDURE  °After your procedure, it is typical to have the following: °· A small amount of blood in your stool. °· Moderate amounts of gas and mild abdominal cramping or bloating. °HOME CARE INSTRUCTIONS °· Do not drive, operate machinery, or sign important documents for 24 hours. °· You may shower and resume your regular physical activities, but move at a slower pace for the first 24 hours. °· Take frequent rest periods for the first 24 hours. °· Walk around or put a warm pack on your abdomen to help reduce abdominal cramping and bloating. °· Drink enough fluids to keep your urine clear or pale yellow. °· You may resume your normal diet as instructed by your health care provider. Avoid heavy or fried foods that are hard to digest. °· Avoid drinking alcohol for 24 hours or as instructed by your health care provider. °· Only take over-the-counter or prescription medicines as directed by your health care provider. °· If a tissue sample (biopsy) was taken during your procedure: °¨ Do not take aspirin or blood thinners for 7 days, or as instructed by your health care provider. °¨ Do not drink alcohol for 7 days, or as instructed by your health care provider. °¨ Eat soft foods for the first 24 hours. °SEEK MEDICAL CARE IF: °You have persistent spotting of blood in your stool 2-3 days after the procedure. °SEEK IMMEDIATE MEDICAL CARE IF: °· You have more than a small spotting of blood in your stool. °· You pass large blood clots in your stool. °· Your abdomen is swollen  (distended). °· You have nausea or vomiting. °· You have a fever. °· You have increasing abdominal pain that is not relieved with medicine. °  °This information is not intended to replace advice given to you by your health care provider. Make sure you discuss any questions you have with your health care provider. °  °Document Released: 11/04/2003 Document Revised: 01/10/2013 Document Reviewed: 11/27/2012 °Elsevier Interactive Patient Education ©2016 [employer] ° °

## 2015-09-02 NOTE — Anesthesia Postprocedure Evaluation (Signed)
Anesthesia Post Note  Patient: Mark Owen  Procedure(s) Performed: Procedure(s) (LRB): COLONOSCOPY WITH PROPOFOL (N/A)  Patient location during evaluation: PACU Anesthesia Type: MAC Level of consciousness: awake and alert Pain management: pain level controlled Vital Signs Assessment: post-procedure vital signs reviewed and stable Respiratory status: spontaneous breathing, nonlabored ventilation, respiratory function stable and patient connected to nasal cannula oxygen Cardiovascular status: blood pressure returned to baseline and stable Postop Assessment: no signs of nausea or vomiting Anesthetic complications: no    Last Vitals:  Filed Vitals:   09/02/15 1250 09/02/15 1300  BP: 137/90 127/78  Pulse: 53 55  Temp:    Resp: 12 12    Last Pain: There were no vitals filed for this visit.               Rayla Pember J

## 2015-09-02 NOTE — H&P (Signed)
  Procedure: Surveillance colonoscopy. 07/15/2014 colonoscopy showed adenocarcinoma of the proximal transverse colon T3N0. 08/08/2014 robotic-assisted right colectomy and umbilical hernia repair performed to remove T3N0 adenocarcinoma.  History: The patient is a 65 year old male born Aug 23, 1950. 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  Past medical history: Hypertension. Type 2 diabetes mellitus. Right and left rotator cuff surgeries.  Medication allergies: Penicillin caused rash  Exam: The patient is alert and lying comfortably on the endoscopy stretcher. Abdomen is soft and nontender to palpation. Lungs are clear to auscultation. Cardiac exam reveals a regular rhythm.  Plan: Proceed with surveillance colonoscopy

## 2015-09-03 ENCOUNTER — Encounter (HOSPITAL_COMMUNITY): Payer: Self-pay | Admitting: Gastroenterology

## 2016-11-25 IMAGING — CT CT ABD-PELV W/ CM
2 of 5 series · 11 of 36 positions shown, 18 images · IV contrast (isovue)
Comparison: None.

CLINICAL DATA: Cancer of transverse colon seen on colonoscopy
07/15/2014

EXAM:
CT CHEST, ABDOMEN, AND PELVIS WITH CONTRAST
TECHNIQUE: Multidetector CT imaging of the chest, abdomen and pelvis was
performed following the standard protocol during bolus
administration of intravenous contrast.
CONTRAST:  100 mL Isovue 300 IV

[Series 601: coronal body · coronal · 1.23mm/px · 1 of 123 slices shown, 2 images]
[im 41/123  soft-tissue]
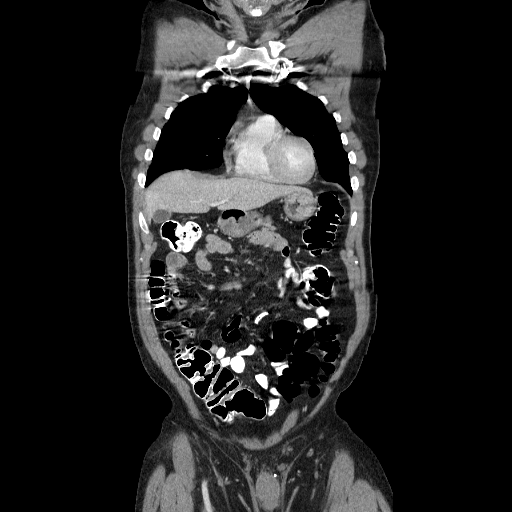
[im 41/123  bone]
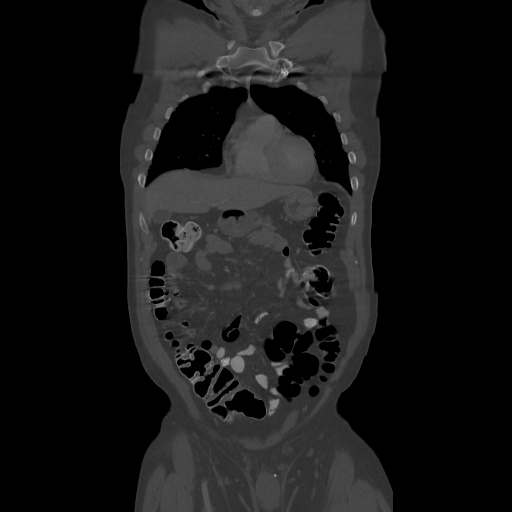

[Series 602: sagittal body · sagittal · 1.23mm/px · 10 of 145 slices shown, 16 images]
[im 13/145  soft-tissue]
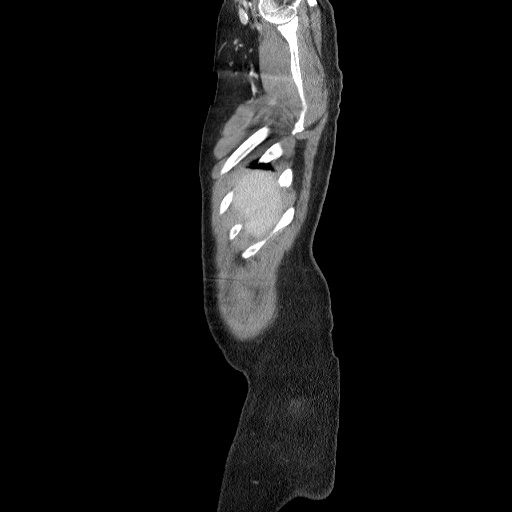
[im 13/145  lung]
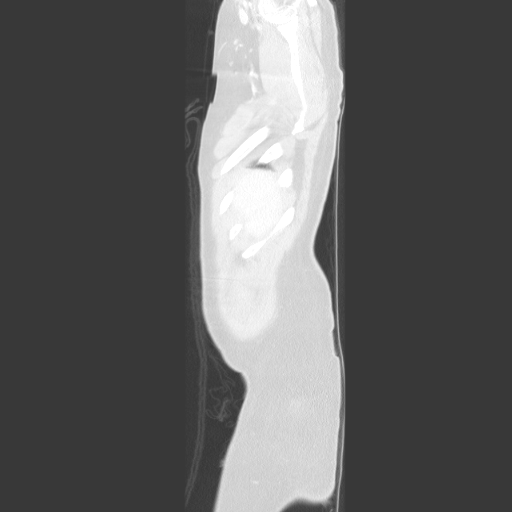
[im 13/145  bone]
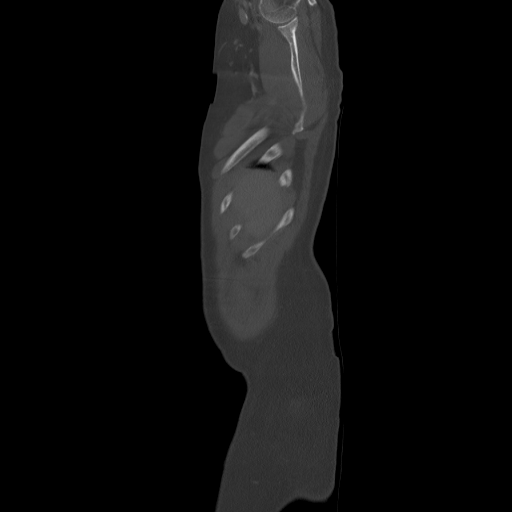
[im 25/145  soft-tissue]
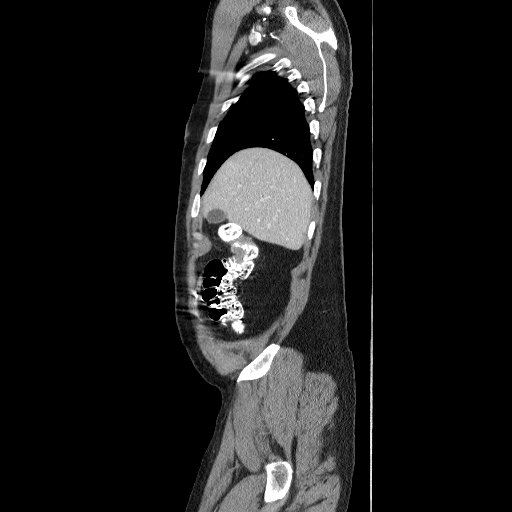
[im 25/145  lung]
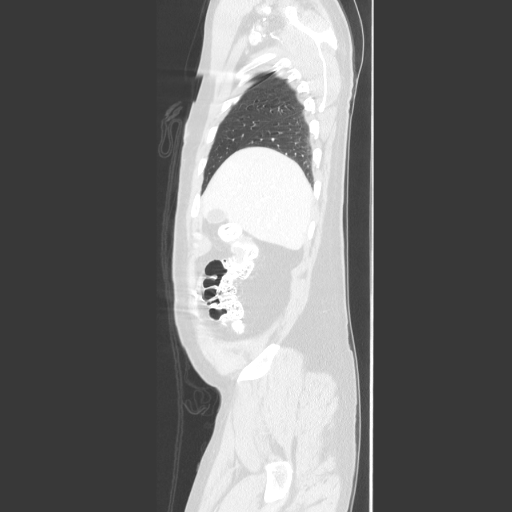
[im 37/145  soft-tissue]
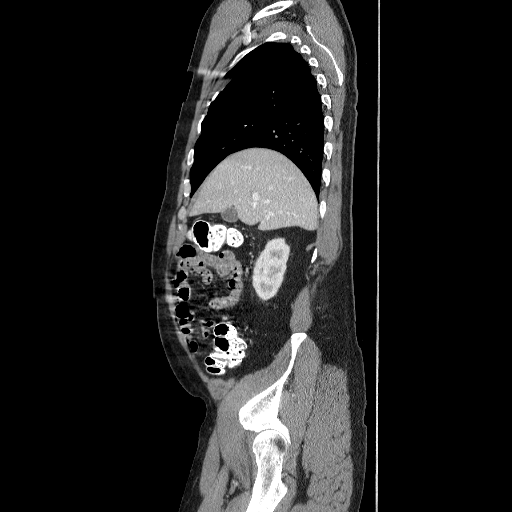
[im 37/145  lung]
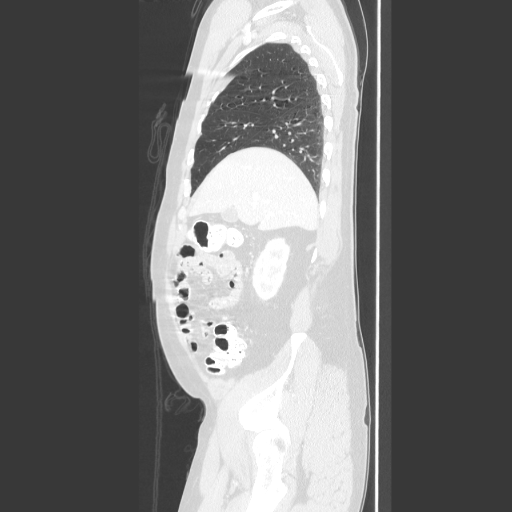
[im 49/145  soft-tissue]
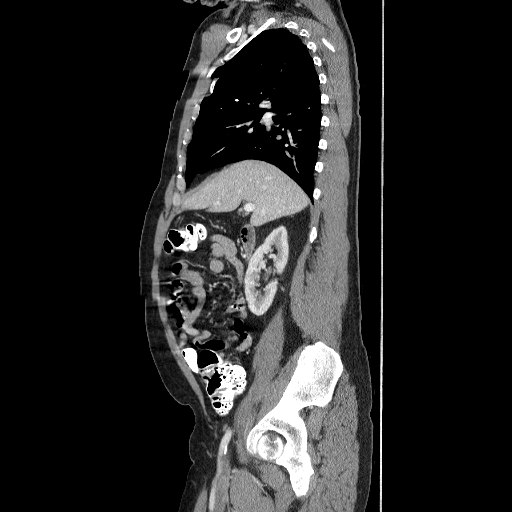
[im 49/145  lung]
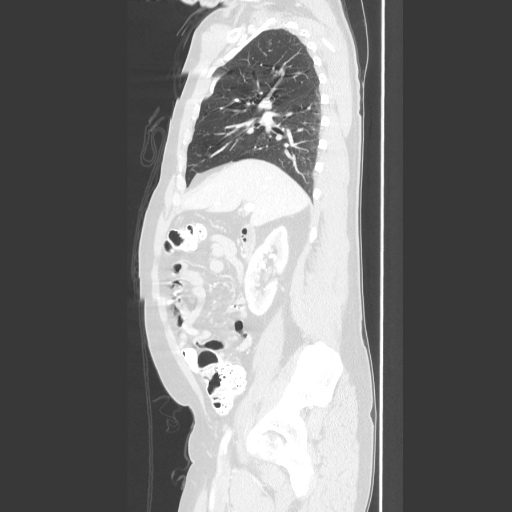
[im 61/145  soft-tissue]
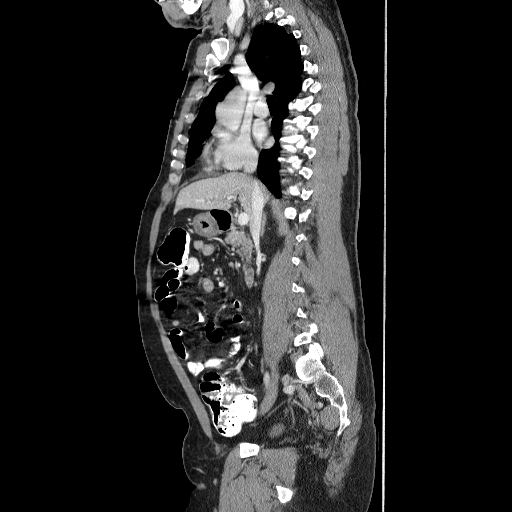
[im 85/145  soft-tissue]
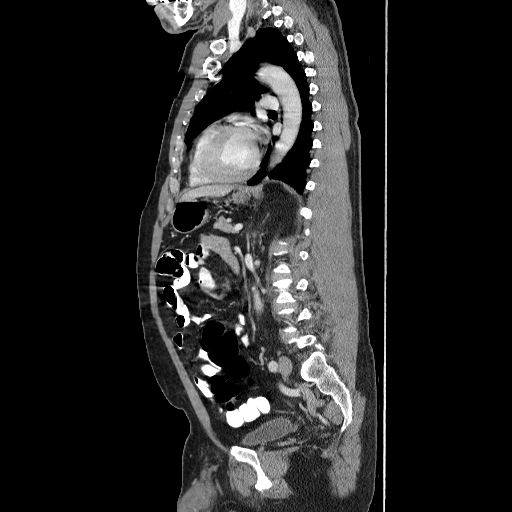
[im 97/145  soft-tissue]
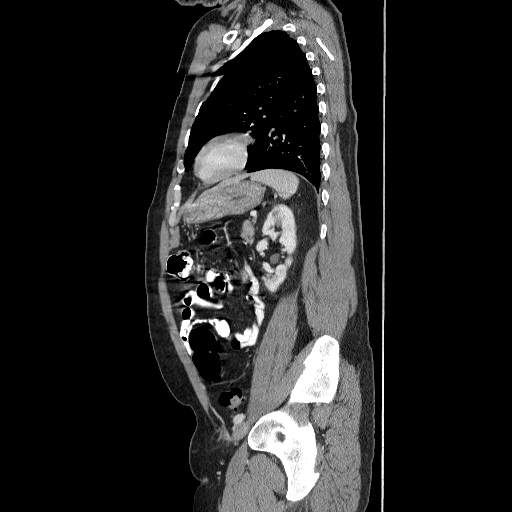
[im 109/145  soft-tissue]
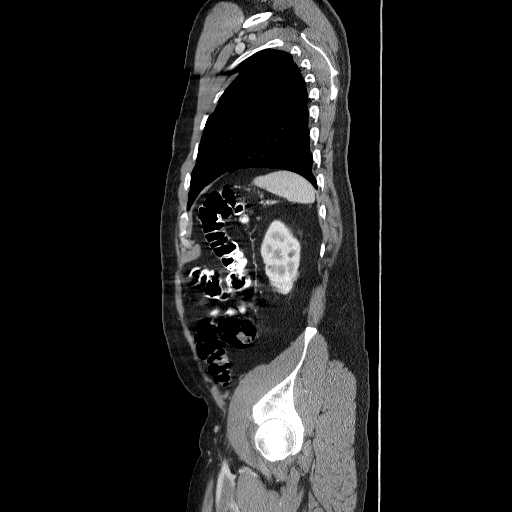
[im 121/145  soft-tissue]
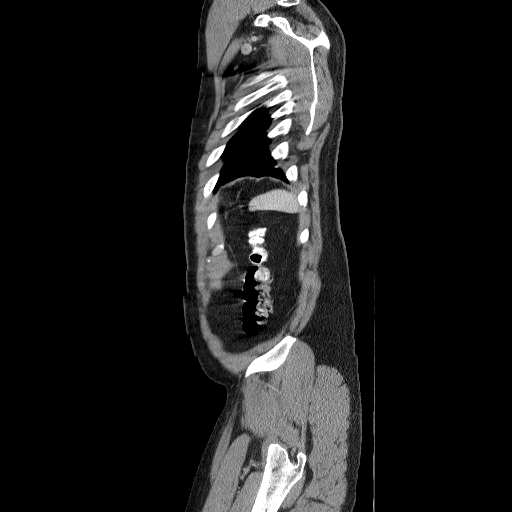
[im 121/145  bone]
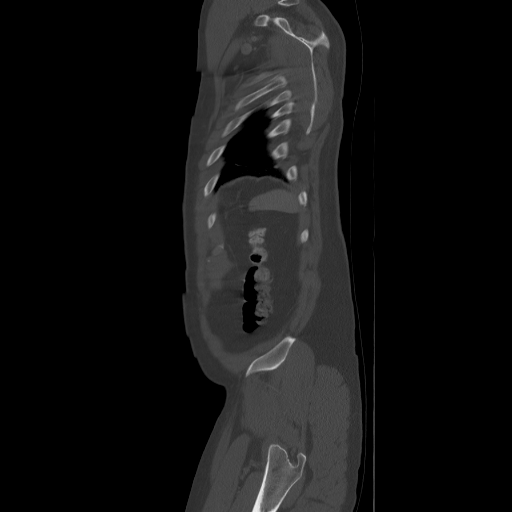
[im 133/145  soft-tissue]
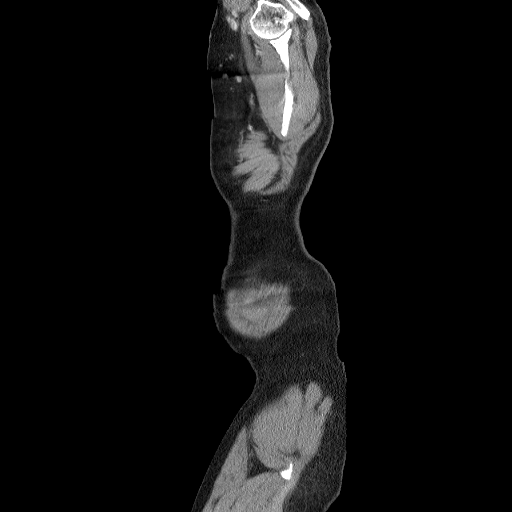

[11 of 36 positions shown; findings below may reference images not displayed]

FINDINGS: CT CHEST FINDINGS

Mediastinum/Nodes: The heart is normal in size. No pericardial
effusion.

8 mm short axis subcarinal node, within normal limits.

Visualized thyroid is unremarkable.

Lungs/Pleura: Evaluation of lung parenchyma is constrained by
respiratory motion.

Mild biapical pleural parenchymal scarring.

6 x 4 mm subpleural nodule in the left lower lobe (series 4/ image
42).

No focal consolidation.

No pleural effusion or pneumothorax.

Musculoskeletal: Probable benign bone island in the T9 vertebral
body (sagittal image 67).

CT ABDOMEN PELVIS FINDINGS

Motion degraded images.

Hepatobiliary: Liver is within normal limits. No
suspicious/enhancing hepatic lesions.

Gallbladder is unremarkable. No intrahepatic or extrahepatic ductal
dilatation.

Pancreas: Within normal limits.

Spleen: Within normal limits.

Adrenals/Urinary Tract: Adrenal glands are within normal limits.

9 mm cyst in the medial right upper kidney (series 3/ image 62).
Scattered left renal cysts measuring up to 7 mm. 8 mm hyperdense
lesion in the lateral interpolar left kidney (series 3/ image 64),
indeterminate. No hydronephrosis.

Bladder is within normal limits.

Stomach/Bowel: Stomach is within normal limits.

No evidence of bowel obstruction.

Normal appendix.

3.0 x 3.2 x 4.1 cm colonic mass in the ascending colon near the
hepatic flexure (series 3/image 63), corresponding to the patient's
mass on colonoscopy. No definite extension into the pericolonic fat.

No regional lymphadenopathy.

Vascular/Lymphatic: No evidence of abdominal aortic aneurysm.

No suspicious abdominopelvic lymphadenopathy.

Reproductive: Mild enlargement of the central gland which indents
the base of the bladder.

Other: No abdominopelvic ascites.

Small fat containing left inguinal hernia. Fat within the right
inguinal canal.

Musculoskeletal: Degenerative changes of the lumbar spine.
IMPRESSION: 4.1 cm colonic mass in the ascending colon near the hepatic flexure,
corresponding to the patient's mass on colonoscopy.

No evidence of metastatic disease in the chest, abdomen, or pelvis.

6 x 4 mm subpleural nodule in the left lower lobe, favored to
reflect a benign subpleural lymph node. This is not considered
suspicious for metastatic disease. Assuming patient is low risk for
primary bronchogenic neoplasm, a single follow-up CT is suggested in
12 months. If high risk, follow-up CT chest is suggested in 6-12
months.

This recommendation follows the consensus statement: Guidelines for
Management of Small Pulmonary Nodules Detected on CT Scans: A
Statement from the [HOSPITAL] as published in Radiology

## 2017-06-14 DIAGNOSIS — I493 Ventricular premature depolarization: Secondary | ICD-10-CM | POA: Diagnosis not present

## 2017-06-14 DIAGNOSIS — H6123 Impacted cerumen, bilateral: Secondary | ICD-10-CM | POA: Diagnosis not present

## 2017-06-14 DIAGNOSIS — I10 Essential (primary) hypertension: Secondary | ICD-10-CM | POA: Diagnosis not present

## 2017-06-14 DIAGNOSIS — Z0000 Encounter for general adult medical examination without abnormal findings: Secondary | ICD-10-CM | POA: Diagnosis not present

## 2017-06-14 DIAGNOSIS — C189 Malignant neoplasm of colon, unspecified: Secondary | ICD-10-CM | POA: Diagnosis not present

## 2017-06-14 DIAGNOSIS — Z79899 Other long term (current) drug therapy: Secondary | ICD-10-CM | POA: Diagnosis not present

## 2017-06-14 DIAGNOSIS — R970 Elevated carcinoembryonic antigen [CEA]: Secondary | ICD-10-CM | POA: Diagnosis not present

## 2017-06-14 DIAGNOSIS — Z125 Encounter for screening for malignant neoplasm of prostate: Secondary | ICD-10-CM | POA: Diagnosis not present

## 2017-06-14 DIAGNOSIS — N5201 Erectile dysfunction due to arterial insufficiency: Secondary | ICD-10-CM | POA: Diagnosis not present

## 2017-06-14 DIAGNOSIS — Z7984 Long term (current) use of oral hypoglycemic drugs: Secondary | ICD-10-CM | POA: Diagnosis not present

## 2017-06-14 DIAGNOSIS — E119 Type 2 diabetes mellitus without complications: Secondary | ICD-10-CM | POA: Diagnosis not present

## 2017-06-14 DIAGNOSIS — D509 Iron deficiency anemia, unspecified: Secondary | ICD-10-CM | POA: Diagnosis not present

## 2017-06-14 DIAGNOSIS — Z23 Encounter for immunization: Secondary | ICD-10-CM | POA: Diagnosis not present

## 2018-02-16 DIAGNOSIS — M545 Low back pain: Secondary | ICD-10-CM | POA: Diagnosis not present

## 2018-06-23 DIAGNOSIS — E119 Type 2 diabetes mellitus without complications: Secondary | ICD-10-CM | POA: Diagnosis not present

## 2018-06-23 DIAGNOSIS — I493 Ventricular premature depolarization: Secondary | ICD-10-CM | POA: Diagnosis not present

## 2018-06-23 DIAGNOSIS — R970 Elevated carcinoembryonic antigen [CEA]: Secondary | ICD-10-CM | POA: Diagnosis not present

## 2018-06-23 DIAGNOSIS — Z125 Encounter for screening for malignant neoplasm of prostate: Secondary | ICD-10-CM | POA: Diagnosis not present

## 2018-06-23 DIAGNOSIS — Z0001 Encounter for general adult medical examination with abnormal findings: Secondary | ICD-10-CM | POA: Diagnosis not present

## 2018-06-23 DIAGNOSIS — I10 Essential (primary) hypertension: Secondary | ICD-10-CM | POA: Diagnosis not present

## 2018-06-23 DIAGNOSIS — Z79899 Other long term (current) drug therapy: Secondary | ICD-10-CM | POA: Diagnosis not present

## 2018-06-23 DIAGNOSIS — C189 Malignant neoplasm of colon, unspecified: Secondary | ICD-10-CM | POA: Diagnosis not present

## 2018-06-23 DIAGNOSIS — N5201 Erectile dysfunction due to arterial insufficiency: Secondary | ICD-10-CM | POA: Diagnosis not present

## 2018-06-23 DIAGNOSIS — D509 Iron deficiency anemia, unspecified: Secondary | ICD-10-CM | POA: Diagnosis not present

## 2019-02-01 DIAGNOSIS — Z23 Encounter for immunization: Secondary | ICD-10-CM | POA: Diagnosis not present

## 2019-05-26 ENCOUNTER — Ambulatory Visit: Payer: 59 | Attending: Internal Medicine

## 2019-05-26 DIAGNOSIS — Z23 Encounter for immunization: Secondary | ICD-10-CM | POA: Insufficient documentation

## 2019-05-26 NOTE — Progress Notes (Signed)
   Covid-19 Vaccination Clinic  Name:  Mark Owen    MRN: [id number] DOB: Oct 08, 1950  05/26/2019  [name] was observed post Covid-19 immunization for 15 minutes without incidence. He was provided with Vaccine Information Sheet and instruction to access the V-Safe system.   [name] was instructed to call 911 with any severe reactions post vaccine: Marland Kitchen Difficulty breathing  . Swelling of your face and throat  . A fast heartbeat  . A bad rash all over your body  . Dizziness and weakness    Immunizations Administered    Name Date Dose VIS Date Route   Pfizer COVID-19 Vaccine 05/26/2019 12:02 PM 0.3 mL 03/16/2019 Intramuscular   Manufacturer: [place]   Lot: [id number]   [place]: [id number]

## 2019-06-18 ENCOUNTER — Ambulatory Visit: Payer: 59 | Attending: Internal Medicine

## 2019-06-18 DIAGNOSIS — Z23 Encounter for immunization: Secondary | ICD-10-CM

## 2019-06-18 NOTE — Progress Notes (Signed)
   Covid-19 Vaccination Clinic  Name:  Mark Owen    MRN: [id number] DOB: 02/04/1951  06/18/2019  Mark Owen was observed post Covid-19 immunization for 15 minutes without incident. He was provided with Vaccine Information Sheet and instruction to access the V-Safe system.   Mark Owen was instructed to call 911 with any severe reactions post vaccine: Marland Kitchen Difficulty breathing  . Swelling of face and throat  . A fast heartbeat  . A bad rash all over body  . Dizziness and weakness   Immunizations Administered    Name Date Dose VIS Date Route   Pfizer COVID-19 Vaccine 06/18/2019  8:44 AM 0.3 mL 03/16/2019 Intramuscular   Manufacturer: [place]   Lot: [id number]   [place]: [id number]

## 2019-07-17 DIAGNOSIS — E119 Type 2 diabetes mellitus without complications: Secondary | ICD-10-CM | POA: Diagnosis not present

## 2019-07-17 DIAGNOSIS — D509 Iron deficiency anemia, unspecified: Secondary | ICD-10-CM | POA: Diagnosis not present

## 2019-07-17 DIAGNOSIS — Z0001 Encounter for general adult medical examination with abnormal findings: Secondary | ICD-10-CM | POA: Diagnosis not present

## 2019-07-17 DIAGNOSIS — R970 Elevated carcinoembryonic antigen [CEA]: Secondary | ICD-10-CM | POA: Diagnosis not present

## 2019-07-17 DIAGNOSIS — Z79899 Other long term (current) drug therapy: Secondary | ICD-10-CM | POA: Diagnosis not present

## 2019-07-17 DIAGNOSIS — C189 Malignant neoplasm of colon, unspecified: Secondary | ICD-10-CM | POA: Diagnosis not present

## 2019-07-17 DIAGNOSIS — I493 Ventricular premature depolarization: Secondary | ICD-10-CM | POA: Diagnosis not present

## 2019-07-17 DIAGNOSIS — Z1389 Encounter for screening for other disorder: Secondary | ICD-10-CM | POA: Diagnosis not present

## 2019-07-17 DIAGNOSIS — Z125 Encounter for screening for malignant neoplasm of prostate: Secondary | ICD-10-CM | POA: Diagnosis not present

## 2019-07-17 DIAGNOSIS — N5201 Erectile dysfunction due to arterial insufficiency: Secondary | ICD-10-CM | POA: Diagnosis not present

## 2019-07-17 DIAGNOSIS — I10 Essential (primary) hypertension: Secondary | ICD-10-CM | POA: Diagnosis not present

## 2019-12-04 DIAGNOSIS — Z1159 Encounter for screening for other viral diseases: Secondary | ICD-10-CM | POA: Diagnosis not present

## 2019-12-07 DIAGNOSIS — Z980 Intestinal bypass and anastomosis status: Secondary | ICD-10-CM | POA: Diagnosis not present

## 2019-12-07 DIAGNOSIS — Z85038 Personal history of other malignant neoplasm of large intestine: Secondary | ICD-10-CM | POA: Diagnosis not present

## 2019-12-07 DIAGNOSIS — K635 Polyp of colon: Secondary | ICD-10-CM | POA: Diagnosis not present

## 2019-12-07 DIAGNOSIS — D490 Neoplasm of unspecified behavior of digestive system: Secondary | ICD-10-CM | POA: Diagnosis not present

## 2019-12-07 DIAGNOSIS — K648 Other hemorrhoids: Secondary | ICD-10-CM | POA: Diagnosis not present

## 2019-12-12 DIAGNOSIS — K635 Polyp of colon: Secondary | ICD-10-CM | POA: Diagnosis not present

## 2020-01-19 ENCOUNTER — Ambulatory Visit: Payer: 59 | Attending: Internal Medicine

## 2020-01-19 DIAGNOSIS — Z23 Encounter for immunization: Secondary | ICD-10-CM

## 2020-01-19 NOTE — Progress Notes (Signed)
   Covid-19 Vaccination Clinic  Name:  Mark Owen    MRN: [id number] DOB: [birth year]  01/19/2020  Mark Owen was observed post Covid-19 immunization for 15 minutes without incident. He was provided with Vaccine Information Sheet and instruction to access the V-Safe system.   Mark Owen was instructed to call 911 with any severe reactions post vaccine: Marland Kitchen Difficulty breathing  . Swelling of face and throat  . A fast heartbeat  . A bad rash all over body  . Dizziness and weakness

## 2020-01-31 DIAGNOSIS — N400 Enlarged prostate without lower urinary tract symptoms: Secondary | ICD-10-CM | POA: Diagnosis not present

## 2020-01-31 DIAGNOSIS — D509 Iron deficiency anemia, unspecified: Secondary | ICD-10-CM | POA: Diagnosis not present

## 2020-01-31 DIAGNOSIS — I10 Essential (primary) hypertension: Secondary | ICD-10-CM | POA: Diagnosis not present

## 2020-01-31 DIAGNOSIS — Z85038 Personal history of other malignant neoplasm of large intestine: Secondary | ICD-10-CM | POA: Diagnosis not present

## 2020-01-31 DIAGNOSIS — E119 Type 2 diabetes mellitus without complications: Secondary | ICD-10-CM | POA: Diagnosis not present

## 2020-01-31 DIAGNOSIS — C184 Malignant neoplasm of transverse colon: Secondary | ICD-10-CM | POA: Diagnosis not present

## 2020-02-18 DIAGNOSIS — Z23 Encounter for immunization: Secondary | ICD-10-CM | POA: Diagnosis not present

## 2020-08-29 ENCOUNTER — Ambulatory Visit: Payer: 59 | Attending: Family

## 2020-08-29 DIAGNOSIS — Z23 Encounter for immunization: Secondary | ICD-10-CM

## 2020-08-29 NOTE — Progress Notes (Signed)
   Covid-19 Vaccination Clinic  Name:  Mark Owen    MRN: [id number] DOB: 1950-05-06  08/29/2020  [name] was observed post Covid-19 immunization for 15 minutes without incident. He was provided with Vaccine Information Sheet and instruction to access the V-Safe system.   [name] was instructed to call 911 with any severe reactions post vaccine: Marland Kitchen Difficulty breathing  . Swelling of face and throat  . A fast heartbeat  . A bad rash all over body  . Dizziness and weakness   Immunizations Administered    Name Date Dose VIS Date Route   Pfizer COVID-19 Vaccine 08/29/2020  3:07 PM 0.3 mL 01/23/2020 Intramuscular   Manufacturer: [place]   Lot: [id number]   [place]: [address]-2

## 2021-02-18 ENCOUNTER — Ambulatory Visit: Payer: 59 | Attending: Family

## 2021-02-18 DIAGNOSIS — Z23 Encounter for immunization: Secondary | ICD-10-CM

## 2021-02-18 NOTE — Progress Notes (Signed)
   Covid-19 Vaccination Clinic  Name:  Mark Owen    MRN: [id number] DOB: 04-08-1950  02/18/2021  [name] was observed post Covid-19 immunization for 15 minutes without incident. He was provided with Vaccine Information Sheet and instruction to access the V-Safe system.   [name] was instructed to call 911 with any severe reactions post vaccine: Difficulty breathing  Swelling of face and throat  A fast heartbeat  A bad rash all over body  Dizziness and weakness   Immunizations Administered     Name Date Dose VIS Date Route   Pfizer Covid-19 Vaccine Bivalent Booster 02/18/2021 11:30 AM 0.3 mL 12/03/2020 Intramuscular   Manufacturer: [place]   Lot: [id number]   [place]: [phone]
